# Patient Record
Sex: Female | Born: 1960
Health system: Southern US, Community
[De-identification: ages and names within clinical notes are randomized; demographics above are authoritative.]

## PROBLEM LIST (undated history)

## (undated) DIAGNOSIS — E079 Disorder of thyroid, unspecified: Secondary | ICD-10-CM

## (undated) DIAGNOSIS — J449 Chronic obstructive pulmonary disease, unspecified: Secondary | ICD-10-CM

## (undated) DIAGNOSIS — R0600 Dyspnea, unspecified: Secondary | ICD-10-CM

## (undated) DIAGNOSIS — Z9889 Other specified postprocedural states: Secondary | ICD-10-CM

## (undated) DIAGNOSIS — C801 Malignant (primary) neoplasm, unspecified: Secondary | ICD-10-CM

## (undated) DIAGNOSIS — F319 Bipolar disorder, unspecified: Secondary | ICD-10-CM

## (undated) DIAGNOSIS — R112 Nausea with vomiting, unspecified: Secondary | ICD-10-CM

## (undated) HISTORY — PX: HEMORROIDECTOMY: SUR656

## (undated) HISTORY — PX: ABDOMINAL HYSTERECTOMY: SHX81

## (undated) HISTORY — PX: BREAST LUMPECTOMY: SHX2

## (undated) HISTORY — DX: Disorder of thyroid, unspecified: E07.9

## (undated) HISTORY — PX: APPENDECTOMY: SHX54

## (undated) HISTORY — PX: CHOLECYSTECTOMY: SHX55

---

## 2001-02-25 ENCOUNTER — Encounter: Admission: RE | Admit: 2001-02-25 | Discharge: 2001-02-25 | Payer: Self-pay | Admitting: Family Medicine

## 2001-03-06 ENCOUNTER — Ambulatory Visit (HOSPITAL_COMMUNITY): Admission: RE | Admit: 2001-03-06 | Discharge: 2001-03-06 | Payer: Self-pay | Admitting: Internal Medicine

## 2001-09-10 ENCOUNTER — Other Ambulatory Visit: Admission: RE | Admit: 2001-09-10 | Discharge: 2001-09-10 | Payer: Self-pay | Admitting: Obstetrics and Gynecology

## 2001-09-11 ENCOUNTER — Ambulatory Visit (HOSPITAL_COMMUNITY): Admission: RE | Admit: 2001-09-11 | Discharge: 2001-09-11 | Payer: Self-pay | Admitting: Pulmonary Disease

## 2001-10-06 ENCOUNTER — Encounter: Payer: Self-pay | Admitting: Emergency Medicine

## 2001-10-06 ENCOUNTER — Emergency Department (HOSPITAL_COMMUNITY): Admission: EM | Admit: 2001-10-06 | Discharge: 2001-10-06 | Payer: Self-pay | Admitting: Emergency Medicine

## 2002-04-25 ENCOUNTER — Encounter: Payer: Self-pay | Admitting: Obstetrics and Gynecology

## 2002-04-25 ENCOUNTER — Inpatient Hospital Stay (HOSPITAL_COMMUNITY): Admission: RE | Admit: 2002-04-25 | Discharge: 2002-04-27 | Payer: Self-pay | Admitting: Obstetrics and Gynecology

## 2002-04-27 ENCOUNTER — Encounter: Payer: Self-pay | Admitting: Obstetrics and Gynecology

## 2004-04-21 ENCOUNTER — Inpatient Hospital Stay (HOSPITAL_COMMUNITY): Admission: EM | Admit: 2004-04-21 | Discharge: 2004-04-22 | Payer: Self-pay | Admitting: *Deleted

## 2005-03-16 ENCOUNTER — Emergency Department (HOSPITAL_COMMUNITY): Admission: EM | Admit: 2005-03-16 | Discharge: 2005-03-16 | Payer: Self-pay | Admitting: Emergency Medicine

## 2006-05-21 ENCOUNTER — Encounter (INDEPENDENT_AMBULATORY_CARE_PROVIDER_SITE_OTHER): Payer: Self-pay | Admitting: Diagnostic Radiology

## 2006-05-21 ENCOUNTER — Encounter (INDEPENDENT_AMBULATORY_CARE_PROVIDER_SITE_OTHER): Payer: Self-pay | Admitting: *Deleted

## 2006-05-21 ENCOUNTER — Encounter: Admission: RE | Admit: 2006-05-21 | Discharge: 2006-05-21 | Payer: Self-pay | Admitting: Family Medicine

## 2006-05-24 ENCOUNTER — Ambulatory Visit (HOSPITAL_COMMUNITY): Admission: RE | Admit: 2006-05-24 | Discharge: 2006-05-24 | Payer: Self-pay | Admitting: General Surgery

## 2006-05-30 ENCOUNTER — Ambulatory Visit (HOSPITAL_COMMUNITY): Admission: RE | Admit: 2006-05-30 | Discharge: 2006-05-30 | Payer: Self-pay | Admitting: General Surgery

## 2006-05-30 ENCOUNTER — Encounter (INDEPENDENT_AMBULATORY_CARE_PROVIDER_SITE_OTHER): Payer: Self-pay | Admitting: *Deleted

## 2006-06-08 ENCOUNTER — Ambulatory Visit (HOSPITAL_COMMUNITY): Admission: RE | Admit: 2006-06-08 | Discharge: 2006-06-08 | Payer: Self-pay | Admitting: General Surgery

## 2006-06-08 ENCOUNTER — Encounter (INDEPENDENT_AMBULATORY_CARE_PROVIDER_SITE_OTHER): Payer: Self-pay | Admitting: Specialist

## 2006-06-20 ENCOUNTER — Encounter: Admission: RE | Admit: 2006-06-20 | Discharge: 2006-06-20 | Payer: Self-pay | Admitting: Oncology

## 2006-06-20 ENCOUNTER — Encounter (HOSPITAL_COMMUNITY): Admission: RE | Admit: 2006-06-20 | Discharge: 2006-07-20 | Payer: Self-pay | Admitting: Oncology

## 2006-06-20 ENCOUNTER — Ambulatory Visit (HOSPITAL_COMMUNITY): Payer: Self-pay | Admitting: Oncology

## 2006-06-20 ENCOUNTER — Encounter (HOSPITAL_COMMUNITY): Admission: RE | Admit: 2006-06-20 | Discharge: 2006-06-22 | Payer: Self-pay | Admitting: Oncology

## 2006-07-02 ENCOUNTER — Ambulatory Visit (HOSPITAL_COMMUNITY): Admission: RE | Admit: 2006-07-02 | Discharge: 2006-07-02 | Payer: Self-pay | Admitting: Oncology

## 2006-07-23 ENCOUNTER — Encounter (HOSPITAL_COMMUNITY): Admission: RE | Admit: 2006-07-23 | Discharge: 2006-08-22 | Payer: Self-pay | Admitting: Oncology

## 2006-08-14 ENCOUNTER — Ambulatory Visit (HOSPITAL_COMMUNITY): Payer: Self-pay | Admitting: Oncology

## 2006-08-28 ENCOUNTER — Encounter (HOSPITAL_COMMUNITY): Admission: RE | Admit: 2006-08-28 | Discharge: 2006-09-07 | Payer: Self-pay | Admitting: Oncology

## 2006-08-28 ENCOUNTER — Encounter: Admission: RE | Admit: 2006-08-28 | Discharge: 2006-09-07 | Payer: Self-pay | Admitting: Oncology

## 2006-09-21 ENCOUNTER — Ambulatory Visit (HOSPITAL_COMMUNITY): Admission: RE | Admit: 2006-09-21 | Discharge: 2006-09-21 | Payer: Self-pay | Admitting: Oncology

## 2006-09-25 ENCOUNTER — Encounter: Admission: RE | Admit: 2006-09-25 | Discharge: 2006-09-25 | Payer: Self-pay | Admitting: Oncology

## 2006-09-25 ENCOUNTER — Encounter (HOSPITAL_COMMUNITY): Admission: RE | Admit: 2006-09-25 | Discharge: 2006-10-25 | Payer: Self-pay | Admitting: Oncology

## 2006-10-15 ENCOUNTER — Ambulatory Visit (HOSPITAL_COMMUNITY): Payer: Self-pay | Admitting: Oncology

## 2006-10-29 ENCOUNTER — Encounter: Admission: RE | Admit: 2006-10-29 | Discharge: 2006-10-29 | Payer: Self-pay | Admitting: Oncology

## 2006-10-29 ENCOUNTER — Encounter (HOSPITAL_COMMUNITY): Admission: RE | Admit: 2006-10-29 | Discharge: 2006-11-28 | Payer: Self-pay | Admitting: Oncology

## 2007-06-04 ENCOUNTER — Encounter (HOSPITAL_COMMUNITY): Admission: RE | Admit: 2007-06-04 | Discharge: 2007-07-04 | Payer: Self-pay | Admitting: Oncology

## 2007-06-04 ENCOUNTER — Ambulatory Visit (HOSPITAL_COMMUNITY): Payer: Self-pay | Admitting: Oncology

## 2007-12-03 ENCOUNTER — Ambulatory Visit (HOSPITAL_COMMUNITY): Payer: Self-pay | Admitting: Oncology

## 2007-12-03 ENCOUNTER — Encounter (HOSPITAL_COMMUNITY): Admission: RE | Admit: 2007-12-03 | Discharge: 2007-12-11 | Payer: Self-pay | Admitting: Oncology

## 2008-01-22 ENCOUNTER — Ambulatory Visit: Payer: Self-pay | Admitting: Orthopedic Surgery

## 2008-01-22 DIAGNOSIS — M7580 Other shoulder lesions, unspecified shoulder: Secondary | ICD-10-CM

## 2008-01-22 DIAGNOSIS — M25519 Pain in unspecified shoulder: Secondary | ICD-10-CM | POA: Insufficient documentation

## 2008-01-22 DIAGNOSIS — M25819 Other specified joint disorders, unspecified shoulder: Secondary | ICD-10-CM | POA: Insufficient documentation

## 2008-01-28 ENCOUNTER — Encounter (HOSPITAL_COMMUNITY): Admission: RE | Admit: 2008-01-28 | Discharge: 2008-02-27 | Payer: Self-pay | Admitting: Oncology

## 2008-01-28 ENCOUNTER — Ambulatory Visit (HOSPITAL_COMMUNITY): Payer: Self-pay | Admitting: Oncology

## 2008-10-02 ENCOUNTER — Ambulatory Visit (HOSPITAL_COMMUNITY): Payer: Self-pay | Admitting: Oncology

## 2008-10-09 ENCOUNTER — Encounter: Admission: RE | Admit: 2008-10-09 | Discharge: 2008-10-09 | Payer: Self-pay | Admitting: Oncology

## 2009-04-19 ENCOUNTER — Ambulatory Visit (HOSPITAL_COMMUNITY): Admission: RE | Admit: 2009-04-19 | Discharge: 2009-04-19 | Payer: Self-pay | Admitting: Family Medicine

## 2009-10-11 ENCOUNTER — Encounter: Admission: RE | Admit: 2009-10-11 | Discharge: 2009-10-11 | Payer: Self-pay | Admitting: Oncology

## 2010-10-12 ENCOUNTER — Encounter: Admission: RE | Admit: 2010-10-12 | Discharge: 2010-10-12 | Payer: Self-pay | Admitting: Oncology

## 2011-03-22 ENCOUNTER — Emergency Department (HOSPITAL_COMMUNITY): Payer: Managed Care, Other (non HMO)

## 2011-03-22 ENCOUNTER — Emergency Department (HOSPITAL_COMMUNITY)
Admission: EM | Admit: 2011-03-22 | Discharge: 2011-03-22 | Disposition: A | Discharge status: Home / Self Care | Payer: Managed Care, Other (non HMO) | Attending: Emergency Medicine | Admitting: Emergency Medicine

## 2011-03-22 DIAGNOSIS — R0789 Other chest pain: Secondary | ICD-10-CM | POA: Insufficient documentation

## 2011-03-22 DIAGNOSIS — E876 Hypokalemia: Secondary | ICD-10-CM | POA: Insufficient documentation

## 2011-03-22 LAB — CBC
Hemoglobin: 14.6 g/dL (ref 12.0–15.0)
MCH: 30.8 pg (ref 26.0–34.0)
MCHC: 34.2 g/dL (ref 30.0–36.0)
RDW: 13.8 % (ref 11.5–15.5)
WBC: 17.2 10*3/uL — ABNORMAL HIGH (ref 4.0–10.5)

## 2011-03-22 LAB — DIFFERENTIAL
Eosinophils Absolute: 0.1 10*3/uL (ref 0.0–0.7)
Eosinophils Relative: 0 % (ref 0–5)
Lymphocytes Relative: 27 % (ref 12–46)
Lymphs Abs: 4.6 10*3/uL — ABNORMAL HIGH (ref 0.7–4.0)
Neutrophils Relative %: 67 % (ref 43–77)

## 2011-03-22 LAB — POCT CARDIAC MARKERS
CKMB, poc: 1.3 ng/mL (ref 1.0–8.0)
CKMB, poc: <1 ng/mL — ABNORMAL LOW (ref 1.0–8.0)
Myoglobin, poc: 70.9 ng/mL (ref 12–200)
Troponin i, poc: <0.05 ng/mL (ref 0.00–0.09)

## 2011-03-22 LAB — BASIC METABOLIC PANEL
BUN: 6 mg/dL (ref 6–23)
Calcium: 9.2 mg/dL (ref 8.4–10.5)
Chloride: 106 mEq/L (ref 96–112)
GFR calc Af Amer: >60 mL/min (ref >60)
GFR calc non Af Amer: >60 mL/min (ref >60)
Glucose, Bld: 123 mg/dL — ABNORMAL HIGH (ref 70–99)

## 2011-03-22 LAB — D-DIMER, QUANTITATIVE: D-Dimer, Quant: 0.22 ug/mL-FEU (ref 0.00–0.48)

## 2011-03-27 ENCOUNTER — Other Ambulatory Visit (HOSPITAL_COMMUNITY): Payer: Self-pay | Admitting: Oncology

## 2011-03-27 DIAGNOSIS — Z09 Encounter for follow-up examination after completed treatment for conditions other than malignant neoplasm: Secondary | ICD-10-CM

## 2011-04-12 ENCOUNTER — Encounter (INDEPENDENT_AMBULATORY_CARE_PROVIDER_SITE_OTHER): Payer: Self-pay | Admitting: Internal Medicine

## 2011-04-28 NOTE — Op Note (Signed)
[facility]  Patient:    Becky Gutierrez, Becky Gutierrez Visit Number: [id number] MRN: [id number]          Service Type: MED Location: 3A A308 01 Attending Physician:  Tilda Burrow Dictated by:   Christin Bach, M.D. Admit Date:  04/24/2002 Discharge Date: 04/27/2002                             Operative Report  PREOPERATIVE DIAGNOSIS:  Postoperative pelvic hematoma.  POSTOPERATIVE DIAGNOSIS:  Postoperative pelvic hematoma.  OPERATION/PROCEDURE:  Laparoscopic evacuation of pelvic hematoma.  SURGEON:  Christin Bach, M.D.  ASSISTANT:  None.  ANESTHESIA:  General -- Wahler, CRNA  COMPLICATIONS:  None.  FINDINGS:  Large, left-sided pelvic hematoma, apparently emanating from the area of the infundibulopelvic ligament and the lateral pelvis dissection.  No ongoing bleeding noted.  DETAILS OF PROCEDURE:  The patient was taken to the operating room and prepped and draped for combined abdominal and vaginal procedure.  With sponge stick placed intravaginally, Foley catheter placed, and abdomen prepped.  The subcuticular 4-0 Dexon sutures at the umbilicus, suprapubic site, right lower quadrant were removed and the suprapubic fascial 0 Vicryl suture identified and opened.  Easy placement of a blunt trocar with the camera used for visualization easily confirmed that we were in without difficulty.  Pneumoperitoneum was achieved under 3 L of CO2, and then the umbilical site could be identified and the 11 mm trocar inserted there, under direct visualization.  The right lower quadrant trocar was similarly placed under direct visualization.  The left lower quadrant site was left intact.  We proceeded to inspect the pelvis and the predominance of the clotting was in the pelvis and the left lower quadrant.  Suction curettage using the suction evacuator, we then proceeded with removal of 300 cc or thereabouts of formed clot which was extractable very easily.  We then  began irrigation and used irrigation to remove thin, filmy bits of clot from the surfaces of the bowel and the lower abdomen.  The upper abdomen was inspected and there was only some thin, watery, serous fluid in the upper area around the liver.  Additional irrigation in the upper quadrants was performed and then suctioned out once the fluid returned to the abdominal area while the patient was in reverse Trendelenburg.  We then inspected the pelvis again, suctioned the area dry and observed the left lower quadrant for any evidence of oozing.  After approximately 10 minutes of suctioning and intermittent inspecting of the left lower quadrant, there was only some small clot remnants which were considered useful to leave, and these were left in place.  Three photos were taken to document intermittent parts of the process.  At this time, deflation of the abdomen and removal of the laparoscopic ports with direct visualization and removal of the suprapubic and right lower quadrant trocar sites performed.  There was absolutely no evidence of bleeding from any of the trocar sites.  We then closed with fascia at the suprapubic and umbilical site with interrupted suture of #0 Vicryl, infiltrated the fascia with Marcaine 0.25% solution as well as infiltrating the subcutaneous tissues, and 4-0 Dexon subcuticular closure of the skin incisions was performed.  Steri-Strips were placed followed by OpSite and the patient tolerated the procedure well and went to recovery room in good condition. Dictated by:   Christin Bach, M.D. Attending Physician:  Tilda Burrow DD:  04/25/02 TD:  04/28/02 Job:  [id number]

## 2011-04-28 NOTE — Op Note (Signed)
[facility]  Patient:    Becky Gutierrez, Becky Gutierrez Visit Number: [id number] MRN: [id number]          Service Type: MED Location: 3A A308 01 Attending Physician:  Tilda Burrow Dictated by:   Christin Bach, M.D. Proc. Date: 04/24/02 Admit Date:  04/24/2002 Discharge Date: 04/27/2002                             Operative Report  PREOPERATIVE DIAGNOSIS:  Entrapped left ovary.  POSTOPERATIVE DIAGNOSIS:  Entrapped left ovary.  PROCEDURE:  Laparoscopic left salpingo-oophorectomy.  SURGEON:  Christin Bach, M.D.  ASSISTANTRobin Searing, ______, CST.  ANESTHESIA:  General.  COMPLICATIONS:  None.  FINDINGS:  Densely entrapped left tube and ovary, able to be shelled out nicely.  INDICATIONS FOR PROCEDURE:  A 50 year old female with longstanding left lower quadrant pain inadequately controlled with hormonal suppression for whom long-term OC use is not an option.  DESCRIPTION OF PROCEDURE:  The patient was taken to the operating room, prepped and draped in the usual fashion for a combined abdominal and vaginal procedure. An infraumbilical vertical 1 cm skin incision was made as well as a transverse suprapubic 1 cm incision. The Veress needle was introduced through the umbilicus and pneumoperitoneum achieved using 3 liters CO2. Laparoscopic direct visualization was used to place the laparoscopic trocar. There were some subumbilical thin adhesions. We were able to place the suprapubic trocar without difficulty and a right lower quadrant 5 mm trocar was also placed. The suprapubic trocar was a 12 mm cutting trocar placed under direct visualization. The subumbilical thin filmy omental adhesions were cut free from the anterior abdominal wall without difficulty. We then proceeded to inspect the pelvis further. The left ovary could be visualized through a small window of adhesions between the remnants of the round ligament and the left ovary itself. This was picked  up, inspected and photo documented. The ovary was essentially obscured except for this small pocket which allowed visualization. We proceeded with shelling out thin filmy adhesions and then grasping the fallopian tube, elevating it and serially using a combination of blunt and sharp dissection peeling away the adjacent sigmoid colon adhesions and gradually identifying the pedicle that represented the tube and ovary complex. This was eventually shelled up free so that only a small amount of the attachments from its inferior portion, the remnants of the utero-ovarian ligament, which were attached to the sigmoid itself. We were able to get a clear view of the fimbria of the left tube, the ovary itself and thereby maintained orientation. The infundibulopelvic ligament was isolated, and cross clamped as in photo 3 with endoGIA placed through the umbilical port. This was fired and the ovary released easily. We then proceeded with using sharp and blunt dissection to free up the remaining remnants of the ovarian attachments to the sigmoid and sigmoid mesentery. This was completed and irrigation copiously performed confirming hemostasis. The procedure was considered successful. While the ureter could not be identified, we were able to be very careful in staying out of the retroperitoneum so risk to this structure was considered minimal.  Approximately 100 cc saline was left in the abdomen and the infraumbilical and suprapubic incisions were closed once laparoscopic instruments were removed, closed at both the fascial level and subcuticular. The level 5 mm ports on each side only required subcuticular 4-0 Dexon closure. Steri-Strips were placed followed by OpSite and the patient went to the recovery room.  Dictated by:   Christin Bach, M.D. Attending Physician:  Tilda Burrow DD:  04/24/02 TD:  04/28/02 Job: [id number]

## 2011-04-28 NOTE — Cardiovascular Report (Signed)
[facility]  Patient:    Becky Gutierrez, Becky Gutierrez                        MRN: [id number] Proc. Date: 03/06/01 Adm. Date:  [id number] Disc. Date: [id number] Attending:  Lewayne Bunting CC:         John Giovanni, M.D.  Thomas C. Daleen Squibb, M.D. [facility]  CV Laboratory  Gerrit Friends. Dietrich Pates, M.D. [facility]   Cardiac Catheterization  INDICATIONS:  Recurrent episodes of chest pain.  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  Multiple studies have been done including those to rule out pulmonary emboli.  Because of continued and recurrent chest pain, the patient wanted to have a cardiac catheterization, and this was discussed with Dr. Sudie Bailey and Dr. Daleen Squibb.  She was referred to the catheterization lab for diagnostic angiogram.  PROCEDURES: 1. Left heart catheterization. 2. Selective coronary arteriography. 3. Selective left ventriculography. 4. Root aortography.  DESCRIPTION OF PROCEDURE:  The procedure was performed from the right femoral artery using 6 French catheters.  She tolerated the procedure well without complication.  We used a Noto to engage the right as there was some catheter-related coronary spasm.  There were no major complications.  She tolerated the procedure well.  HEMODYNAMICS:  The central aortic pressure was 101/62.  LV pressure 104/12. There was no gradient on pullback across the aortic valve.  ANGIOGRAPHIC DATA:  LEFT VENTRICULOGRAPHY:  Ventriculography performed in the RAO projection revealed preserved global systolic function.  No segmental abnormalities contraction were identified.  Ejection fraction appeared to be in excess of 55%.  Proximal root aortography revealed good motion of the aortic leaflets.  No significant aortic regurgitation was noted.  There was no evidence of proximal aortic root dissection.  There was no obvious calcification of the coronary arteries.  The left main coronary  artery was a large caliber vessel being approximately 5 mm in size and without significant focal disease.  The left anterior descending artery coursed to the apex where it just barely wrapped the apical tip.  There was one major diagonal branch and multiple septal perforators.  The LAD was free of significant disease.  The circumflex proper provided a tiny first marginal and then two subsequent marginal branches.  No focal lesions were noted.  The right coronary artery demonstrated some catheter-induced spasm just inside the ostium.  This was at the tip of the catheter.  However, after the administration of intracoronary nitroglycerin, there was no significant focal narrowing.  The posterior descending branch and posterolateral branch were free of critical disease.  CONCLUSIONS: 1. Normal left ventricular function. 2. No evidence of aortic root dissection. 3. No significant coronary focal abnormalities.  DISPOSITION:  I have spoken with Dr. Sudie Bailey.  They are in agreement that she will be discharged from the day catheterization area later in the day. She will follow up in the office to see him. DD:  03/06/01 TD:  03/07/01 Job: [id number]

## 2011-04-28 NOTE — Procedures (Signed)
[facility]  Patient:    Becky Gutierrez, Becky Gutierrez Visit Number: [id number] MRN: [id number]          Service Type: Attending:  Kari Baars, M.D. Dictated by:   Kari Baars, M.D. Adm. Date:  09/11/01                      Pulmonary Function Test Inter.  IMPRESSION: 1. Spirometry is normal. 2. Lung volumes are normal. 3. DLCO is normal. Dictated by:   Kari Baars, M.D. Attending:  Kari Baars, M.D. DD:  09/11/01 TD:  09/12/01 Job: [id number]

## 2011-04-28 NOTE — H&P (Signed)
Becky Gutierrez, Becky Gutierrez        ACCOUNT NO.:  [id number]   MEDICAL RECORD NO.:  [id number]           PATIENT TYPE:  AMB   LOCATION:                                FACILITY:  [facility]   PHYSICIAN:  Dalia Heading, M.D.  DATE OF BIRTH:  09/25/1961   DATE OF ADMISSION:  DATE OF DISCHARGE:  LH                                HISTORY & PHYSICAL   CHIEF COMPLAINT:  Left breast carcinoma.   HISTORY OF PRESENT ILLNESS:  The patient is a 50 year old white female who  is referred for evaluation and treatment of left breast carcinoma.  This was  found on self-breast examination.  A biopsy of this at the breast center was  found to be invasive mammary carcinoma.  She has no immediate family history  of breast carcinoma.  She had left breast biopsy in the remote past, which  was negative for malignancy.   PAST MEDICAL HISTORY:  Bipolar disorder, unspecified SVT which has resolved,  asthma, carpal tunnel syndrome.   PAST SURGICAL HISTORY:  C-sections x2, TAH, appendectomy, benign tumor  removal of left breast, hemorrhoidectomy, and laparoscopic cholecystectomy.   CURRENT MEDICATIONS:  1.  Zyprexa 5 mg p.o. q.4h. p.r.n.  2.  Wellbutrin 150 mg p.o. t.i.d.  3.  Multivitamin with iron q.d.  4.  Calcium supplements.   ALLERGIES:  PHENERGAN, CODEINE, MORPHINE, XANAX.   REVIEW OF SYSTEMS:  The patient denies drinking or smoking.   PHYSICAL EXAMINATION:  The patient is a well-developed, well-nourished white  female in no acute distress.  NECK: Supple without lymphadenopathy.  LUNGS:  Clear to auscultation with equal breath sounds bilaterally.  No  expiratory wheezing is noted.  HEART: Regular rate and rhythm without S3, S4, or murmurs.  BREASTS: Right breast examination reveals no dominant mass, nipple  discharge, or dimpling.  The axilla is negative for palpable nodes.  Left  breast examination reveals a 2-cm dominant mass noted to 2 o'clock position.  The axilla is negative for palpable  nodes.   IMPRESSION:  Left breast carcinoma.   PLAN:  The patient is scheduled for left partial mastectomy, sentinel lymph  node biopsy, possible axillary dissection on May 30, 2006.  The risks and  benefits of the procedure including bleeding, infection, nerve injury, arm  swelling, possibility of the re-excisional surgery were fully explained to  the patient, gave informed consent.      Dalia Heading, M.D.  Electronically Signed     MAJ/MEDQ  D:  05/29/2006  T:  05/29/2006  Job:  [id number]   cc:   Mila Homer. Sudie Bailey, M.D.  Fax: [phone]

## 2011-04-28 NOTE — H&P (Signed)
Becky Gutierrez, FOULKS NO.:  [id number]   MEDICAL RECORD NO.:  [id number]                  PATIENT TYPE:   LOCATION:                                       FACILITY:   PHYSICIAN:  Lionel December, M.D.                 DATE OF BIRTH:  1961-11-12   DATE OF ADMISSION:  DATE OF DISCHARGE:                                HISTORY & PHYSICAL   CHIEF COMPLAINT:  Chronic diarrhea x1 month and bright red blood per rectum  with every bowel movement.   HISTORY OF PRESENT ILLNESS:  Patient states that she has had chronic  diarrhea for one month.  Patient states that she has between 1-20 bowel  movements per day and that she sees bright red blood on the toilet paper  with every bowel movement.  The patient denies melena and states that she  has abdominal pain when she is having stool and that she sometimes passes  clots of blood.  The patient has had a previous history of rectal bleeding  and had a flexible sigmoidoscopy on September 25, 1996, done by Dr. Karilyn Cota.  The patient was diagnosed as having hematochezia secondary to hemorrhoids.  On that same date, there was a flat polyp removed in the proximal sigmoid  colon with an edematous surface.  It was later shown to be hyperplastic  polyps.  The patient subsequently had a hemorrhoidectomy on October 14, 1996.  Patient states that she has no symptoms of reflux.  She has no  hematemesis.  She has no swallowing difficulties.  She does report a history  of PAT.  Patient further reports that she takes multiple medications for  depression, bipolar disorder, asthma, and also hormone replacement therapy.  Patient denies shortness of breath or chest pain.  She denies edema of the  extremities.   OBJECTIVE:  VITAL SIGNS:  Weight 177-3/4.  Height 5 foot 8 inches.  Blood  pressure 110/70, pulse 86.  HEENT:  Head is normocephalic and atraumatic.  Sclerae are anicteric.  Conjunctivae are pink.  Oropharynx is pink and moist  without lesions.  Patient has some clear rhinorrhea.  There are several shotty submandibular  lymph nodes.  NECK:  No masses.  No thyromegaly.  LUNGS:  Clear to auscultation bilaterally.  HEART:  Regular rate and rhythm without murmurs, rubs or gallops.  EXTREMITIES:  No clubbing, cyanosis or edema.  ABDOMEN:  Soft without hepatosplenomegaly.  Bowel sounds are present x4.  No  masses were palpated.  There is moderate tenderness to palpation in the left  lower quadrant.  RECTAL:  The patient has at least external hemorrhoids.  No fissure was  noted.  No masses were noted.  Sphincter tone was normal.  Hemoccult was  positive.  SKIN:  Warm, dry, and without jaundice.   IMPRESSION:  A patient with a history of rectal bleeding and hemorrhoids  presents with chronic  diarrhea and bright red blood per rectum.  Patient has  been diagnosed as having irritable bowel syndrome with a diarrheal  component.  Patient is currently on multiple medications for bipolar  disorder.  The patient states she has been having pain with defecation and  significant hematochezia.   Laboratory studies were done at [facility] on May 03, 2004.  WBC is  14.8, hemoglobin 12.6, hematocrit 36.7, MCV 91.4, platelet count 400.  Sodium 141, potassium 3.4, chloride 111, CO2 25, glucose 153, BUN/creatinine  6/0.7, calcium 9.1.  Total bilirubin 0.5, alkaline phosphatase 75, SGOT 15,  SGPT 14, total protein 5.8, albumin 3.3.  Stool WBC was negative.  C. diff  toxin was negative.  Helicobacter pylori was negative.   Patient's laboratory studies are significant for an elevated white blood  cell count, otherwise unremarkable.  Patient has had a hemorrhoidectomy, and  it is likely that the bright red blood she is seeing is from a recurrence of  hemorrhoid disease.   The patient did have a flexible sigmoid in 1997 with the removal of a  hyperplastic polyp.  The patient should be considered for colonoscopy.   PLAN:   Cort enemas b.i.d. x14 days.  Patient was advised and given samples  of fiber to take 1 q.d. to help smooth out the bowel movements of her  irritable bowel syndrome.  The patient will be scheduled for a colonoscopy  with Dr. Karilyn Cota, per her request.  More recommendations to follow after the  procedure.   Thank you for allowing [place] to participate in the care of this patient.     _____________________________________  ___________________________________________  Ashok Pall, PA                        Lionel December, M.D.   GC/MEDQ  D:  05/03/2004  T:  05/03/2004  Job:  [id number]   cc:   Mila Homer. Sudie Bailey, M.D.  [address] [place], [place] [address]  Fax: [phone]

## 2011-04-28 NOTE — H&P (Signed)
NAME:  DEJAI, SCHUBACH                         ACCOUNT NO.:  [id number]   MEDICAL RECORD NO.:  [id number]                   PATIENT TYPE:  INP   LOCATION:  IC06                                 FACILITY:  [facility]   PHYSICIAN:  Mila Homer. Sudie Bailey, M.D.           DATE OF BIRTH:  1961-11-19   DATE OF ADMISSION:  04/21/2004  DATE OF DISCHARGE:                                HISTORY & PHYSICAL   ADDENDUM:  She has had asthma for some years and has been on Flovent two  puffs a day in the past.   DIAGNOSES:  1. Asthma.  2. Tobacco use disorder.   PLAN:  1. Treatment for the asthma will be Singulair 10 mg daily, Flovent 44 two     puffs daily with Albuterol two puffs q.4h p.r.n. asthma .  2. We will also put her back on Wellbutrin 150 mg b.i.d.     ___________________________________________                                         Mila Homer. Sudie Bailey, M.D.   SDK/MEDQ  D:  04/21/2004  T:  04/21/2004  Job:  [id number]

## 2011-04-28 NOTE — Op Note (Signed)
NAMETHAO, BAUZA        ACCOUNT NO.:  [id number]   MEDICAL RECORD NO.:  [id number]          PATIENT TYPE:  AMB   LOCATION:  DAY                           FACILITY:  [facility]   PHYSICIAN:  Dalia Heading, M.D.  DATE OF BIRTH:  Dec 27, 1960   DATE OF PROCEDURE:  05/30/2006  DATE OF DISCHARGE:                                 OPERATIVE REPORT   PREOPERATIVE DIAGNOSIS:  Left breast carcinoma.   POSTOPERATIVE DIAGNOSIS:  Left breast carcinoma.   PROCEDURE:  Left partial mastectomy, sentinel lymph node biopsy, injection  of left breast.   SURGEON:  Dalia Heading, M.D.   ANESTHESIA:  General endotracheal.   INDICATIONS:  The patient is a 50 year old white female recently diagnosed  with infiltrating mammary carcinoma of the left breast.  This was confirmed  by tissue biopsy.  The patient now comes to the operating room for left  partial mastectomy, sentinel lymph node biopsy, possible axillary  dissection.  The risks and benefits of the procedures including bleeding,  infection, pain, and the possibility of left arm swelling were fully  explained to the patient who gave informed consent.  She also realizes she  may have to come back for re-excision should the margins of the left breast  tumor be not clear.   PROCEDURE NOTE:  The patient underwent radioactive nuclide injection two  hours prior to the beginning of the procedure.  This was done in the  retroareolar region.  After the induction of general endotracheal  anesthesia, the left breast was prepped and draped in the usual sterile  technique with Betadine.  Surgical site confirmation was performed.  4 mL of  blue dye was injected subdermally over the tumor which was at the 2 o'clock  position.  This was then massaged for 5 minutes..   We first began with the sentinel lymph node biopsy.  The en vivo count was  200 and ex vivo count of the first lymph node was 575.  The lymph node was  blue.  A second blue lymph node was  also found with en vivo counts of 400,  ex vivo counts of 500.  The basin count was less than 50.  These lymph nodes  were sent for frozen section and the pathologist stated that these were  negative for malignancy.  The wound was irrigated normal saline.  Any  bleeding was controlled using small clips and Bovie electrocautery.  The  subcutaneous layer was reapproximated using a 3-0 Vicryl interrupted suture.  The skin was closed using a 4-0 Vicryl subcuticular suture.  Dermabond was  then applied.  0.5% Sensorcaine was instilled in the surrounding wound.   Next, an incision was made over the tumor which measured greater than 2 cm  in size.  The tumor was excised with grossly normal appearing breast tissue  circumferentially.  The tumor was oriented with a long suture lateral and a  short suture superior.  The specimen was sent to pathology for further  examination.  Any bleeding was controlled using Bovie electrocautery.  The  wound was irrigated with normal saline.  0.5% Sensorcaine was instilled in  the surrounding skin.  The skin was closed using a 4-0 Vicryl subcuticular  suture.  Dermabond was then applied.   All tape and needle counts were correct at the end of the procedure.  The  patient was extubated in the operating room and went back to the recovery  room awake in stable condition.   COMPLICATIONS:  None.   SPECIMEN:  Sentinel lymph nodes x2, left breast tissue.   BLOOD LOSS:  Less than 100 mL.      Dalia Heading, M.D.  Electronically Signed     MAJ/MEDQ  D:  05/30/2006  T:  05/30/2006  Job:  [id number]   cc:   Mila Homer. Sudie Bailey, M.D.  Fax: [phone]

## 2011-04-28 NOTE — Op Note (Signed)
Becky Gutierrez, MARKIE        ACCOUNT NO.:  [id number]   MEDICAL RECORD NO.:  [id number]          PATIENT TYPE:  AMB   LOCATION:  DAY                           FACILITY:  [facility]   PHYSICIAN:  Dalia Heading, M.D.  DATE OF BIRTH:  02/21/1961   DATE OF PROCEDURE:  06/08/2006  DATE OF DISCHARGE:                                 OPERATIVE REPORT   PREOPERATIVE DIAGNOSIS:  Left breast carcinoma, status post left partial  mastectomy with involved margin.   POSTOPERATIVE DIAGNOSIS:  Left breast carcinoma, status post left partial  mastectomy with involved margin.   PROCEDURE:  Left breast biopsy.   SURGEON:  Dalia Heading, M.D.   ANESTHESIA:  MAC.   INDICATIONS:  The patient is a 50 year old white female status post a left  partial mastectomy for invasive ductal carcinoma, who was found to have the  anterior margin containing tumor.  The patient comes back to the operating  room for re-excision of the anterior aspect of the previous left partial  mastectomy.  The risks and benefits of the procedure were fully explained to  the patient, who gave informed consent.   PROCEDURE NOTE:  The patient was placed in supine position.  The left breast  was prepped and draped using the usual sterile technique with Betadine.  Xylocaine 1% was used local anesthesia.   An elliptical incision was made around the previous surgical incision site.  This was in the upper, outer portion of the left breast.  Skin along with  some tissue from the anterior portion of the previous mastectomy site was  excised in total without difficulty.  A suture was placed medial to orient  the specimen.  The specimen sent to pathology for further examination.  Any  bleeding was controlled using Bovie electrocautery.  The wound was irrigated  with normal saline.  The wound was grossly inspected and no obvious tumor  remained in the specimen.  The skin was closed using a 4-0 Vicryl  subcuticular suture.  Dermabond was  then applied.   All tape and needle counts were correct at the end of the procedure.  The  patient was transferred to PACU in stable condition.   COMPLICATIONS:  None.   SPECIMEN:  Left breast tissue, suture medial.   BLOOD LOSS:  Minimal.      Dalia Heading, M.D.  Electronically Signed     MAJ/MEDQ  D:  06/08/2006  T:  06/08/2006  Job:  [id number]   cc:   Ladona Horns. Mariel Sleet, MD  Fax: [phone]   Mila Homer. Sudie Bailey, M.D.  Fax: [phone]

## 2011-04-28 NOTE — Procedures (Signed)
Becky Gutierrez, Becky Gutierrez               ACCOUNT NO.:  [id number]   MEDICAL RECORD NO.:  [id number]          PATIENT TYPE:  OUT   LOCATION:  RESP                          FACILITY:  [facility]   PHYSICIAN:  Edward L. Juanetta Gosling, M.D.DATE OF BIRTH:  1961/07/11   DATE OF PROCEDURE:  DATE OF DISCHARGE:  04/19/2009                            PULMONARY FUNCTION TEST   1. Spirometry is normal.  2. Lung volumes are normal.  3. DLCO is normal.      Edward L. Juanetta Gosling, M.D.  Electronically Signed     ELH/MEDQ  D:  04/23/2009  T:  04/23/2009  Job:  [id number]   cc:   Mila Homer. Sudie Bailey, M.D.  Fax: [phone]

## 2011-04-28 NOTE — Discharge Summary (Signed)
[facility]  Patient:    Becky Gutierrez, Becky Gutierrez Visit Number: [id number] MRN: [id number]          Service Type: MED Location: 3A A308 01 Attending Physician:  Tilda Burrow Dictated by:   Christin Bach, M.D. Admit Date:  04/24/2002 Discharge Date: 04/27/2002                             Discharge Summary  ADMISSION DIAGNOSIS:  Tender left ovary secondary to ovarian adhesions status post hysterectomy.  DISCHARGE DIAGNOSES: 1. Tender left ovary secondary to ovarian adhesions status post hysterectomy. 2. Postoperative pelvic hematoma, anemia secondary to blood loss. 3. Bilateral pneumonia, bilateral interstitial pneumonia. 4. Anemia secondary to postoperative bleeding.  DISCHARGE MEDICATIONS: 1. Tylox 1 p.o. q.4h. p.r.n. pain dispense #20. 2. Z-Pak 500 mg p.o. x5 tablets over 4 days. 3. Toradol 10 mg p.o. q.8h. p.r.n. pain during work day. 4. Iron sulfate 325 mg p.o. b.i.d. x30 days. 5. Inhaler Serevent MDI 2 puffs b.i.d. 6. Flovent 110 mcg 2 puffs b.i.d. 7. Albuterol MDI 2 puffs q.4h. p.r.n.  HISTORY OF PRESENT ILLNESS:  This 50 year old female is status post multiple GYN surgeries and was admitted for laparoscopic removal of the residual left ovary.  She has had progressive incapacitating functional left ovarian discomfort which has no longer effectively suppressed with oral contraceptive management.  See admitting history for further details.  HOSPITAL COURSE:  The patient was admitted through day surgery with admission hemoglobin of 13.0, hematocrit 38.6, white count 11,000.  She underwent laparoscopic left salpingo-oophorectomy which required dissection of the left tube and ovary free from the bowel, the sigmoid colon, as well as from the remnants of the round ligament on the left.  This was performed in an apparent effective fashion with intraoperative blood loss estimated at 100 cc.  Postoperatively the patient had greater than normal amounts  of nausea, occasional vomiting and received IV Zofran without adequate relief.  On the afternoon of surgery she was noted to have some fullness on the right side around the site of the right lower quadrant trocar port.  There was some oozing there and it was thought that perhaps she was bleeding from the abdominal wall.  Ice pack and pressure were encouraged.  The patient chose not to use this.  Over the night the patient had progression of bruising in the right lower quadrant predominantly with extension into the inguinal area even effecting the right labia majora.  The patient also had some settling into the right flank.  CBC showed her hemoglobin dropped to 9.2, with hematocrit 27.1, on Apr 25, 2002, evidencing a large amount of postoperative blood loss out of proportion to the surgical event.  She then was having persistent pain and nausea and CT scan of the abdomen revealed intrapelvic hematoma rather than abdominal wall hematoma.  White count was markedly elevated on the morning of Apr 25, 2002, at 26,800 with 78 neutrophils.  She was found on CT of the abdomen to have the pelvic hematoma.  Additionally CT noted the presence of atelectasis in the left lower lobe consistent with left lower lobe pneumonia.  There was minimal hemorrhage in the subcutaneous tissues on the right lateral abdominal wall.  The patients pain and nausea were sufficient that re-exploration was considered warranted and she was taken to the operating room that afternoon with evacuation of approximately 400 cc. of formed clot in the pelvis which was thought to represent  at least 1000 cc. of blood loss.  The patient then had significant improvement in her discomfort and resolution of her nausea that evening postoperatively.  The following morning hematocrit had equilibrated further to a hemoglobin of 8.0, hematocrit 23.4.  This remained stable for an additional 24 hours.  The patient had an appropriate increase  in her fatigue after excursion based on the low hematocrit.  She ran temperatures as high a 102.0 noted on the day of the re-exploration.  Additionally her leukocytosis continued with a white count of 32,200 on the evening of Apr 25, 2002, gradually declining until 25,100 on the day of discharge Apr 27, 2002.  On Apr 26, 2002, she only had a single temperature greater than 100.0, 101.5 noted at 9 p.m.  On the day of discharge she improved sufficiently through the day to be discharged at 5 p.m. on Apr 27, 2002, for follow up in our office.  FOLLOWUP:  Follow up in 1 week in our office.  ACTIVITY:  The patient is to have limited activity.  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 Dictated by:   Christin Bach, M.D. Attending Physician:  Tilda Burrow DD:  04/27/02 TD:  04/29/02 Job: [id number] JO/[id number]

## 2011-04-28 NOTE — H&P (Signed)
Becky Gutierrez, SCHORR        ACCOUNT NO.:  [id number]   MEDICAL RECORD NO.:  [id number]          PATIENT TYPE:  AMB   LOCATION:  DAY                           FACILITY:  [facility]   PHYSICIAN:  Dalia Heading, M.D.  DATE OF BIRTH:  03-May-1961   DATE OF ADMISSION:  DATE OF DISCHARGE:  LH                                HISTORY & PHYSICAL   AGE:  50 years old   CHIEF COMPLAINT:  Left breast carcinoma.   HISTORY OF PRESENT ILLNESS:  The patient is a 51 year old white female  status post a left partial mastectomy with sentinel lymph node biopsy on  May 30, 2006, who was found to have invasive ductal carcinoma.  The  anterior margin was noted to have tumor at that area.  The patient now comes  back to the operating for re-excision of the anterior aspect of the partial  mastectomy site.   PAST MEDICAL HISTORY:  Includes bipolar disorder, unspecified SVT which has  resolved, asthma, carpal tunnel syndrome.   PAST SURGICAL HISTORY:  As noted above, C-sections x2, TAH, appendectomy,  benign tumor removal left breast in the remote past, hemorrhoidectomy,  laparoscopic cholecystectomy.   CURRENT MEDICATIONS:  1.  Zyprexa 5 mg p.o. q.4h. p.r.n.  2.  Wellbutrin 150 mg p.o. t.i.d.  3.  Multivitamin with iron.  4.  Calcium supplements.   ALLERGIES:  1.  PHENERGAN.  2.  CODEINE.  3.  MORPHINE,  4.  Prudy Feeler.   REVIEW OF SYSTEMS:  The patient denies drinking or smoking.  She is does  have a history of a difficult intubation due to anterior and deep  displacement of the vocal cords.   PHYSICAL EXAMINATION:  GENERAL:  The patient is a well-developed, well-  nourished white female in no acute distress.  LUNGS:  Clear to auscultation with equal breath sounds bilaterally.  HEART:  Reveals a regular rate and rhythm without S3, S4, or murmurs.  BREASTS:  Left breast examination reveals well-healed surgical scars in the  left axilla and superior aspect of left breast.   IMPRESSION:  Left  breast carcinoma, unclear margins.   PLAN:  The patient is scheduled for re-excision of the left breast on June 08, 2006.  The risks and benefits of the procedure including bleeding,  infection, and bruising at the wound were fully explained to the patient,  who gave informed consent.      Dalia Heading, M.D.  Electronically Signed     MAJ/MEDQ  D:  06/05/2006  T:  06/05/2006  Job:  [id number]   cc:   Short Stay at [facility] S. Mariel Sleet, MD  Fax: [phone]   Mila Homer. Sudie Bailey, M.D.  Fax: [phone]
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
## 2011-05-01 ENCOUNTER — Ambulatory Visit
Admission: RE | Admit: 2011-05-01 | Discharge: 2011-05-01 | Disposition: A | Discharge status: Home / Self Care | Payer: Managed Care, Other (non HMO) | Source: Ambulatory Visit | Attending: Oncology | Admitting: Oncology

## 2011-05-01 DIAGNOSIS — Z09 Encounter for follow-up examination after completed treatment for conditions other than malignant neoplasm: Secondary | ICD-10-CM

## 2011-06-08 ENCOUNTER — Encounter (INDEPENDENT_AMBULATORY_CARE_PROVIDER_SITE_OTHER): Payer: Self-pay | Admitting: Internal Medicine

## 2011-06-08 ENCOUNTER — Ambulatory Visit (HOSPITAL_COMMUNITY)
Admission: RE | Admit: 2011-06-08 | Payer: Managed Care, Other (non HMO) | Source: Ambulatory Visit | Admitting: Internal Medicine

## 2011-09-01 LAB — COMPREHENSIVE METABOLIC PANEL
Alkaline Phosphatase: 104
BUN: 8
GFR calc non Af Amer: >60
Glucose, Bld: 98
Potassium: 3.6
Total Bilirubin: 0.4
Total Protein: 6.8

## 2011-09-01 LAB — LIPID PANEL
Cholesterol: 244 — ABNORMAL HIGH
LDL Cholesterol: 170 — ABNORMAL HIGH
Triglycerides: 159 — ABNORMAL HIGH

## 2011-09-01 LAB — DIFFERENTIAL
Basophils Absolute: 0.1
Basophils Relative: 1
Monocytes Relative: 6
Neutro Abs: 9.7 — ABNORMAL HIGH
Neutrophils Relative %: 69

## 2011-09-01 LAB — CBC
HCT: 38.8
Hemoglobin: 13.4
RDW: 13.8

## 2011-09-14 ENCOUNTER — Emergency Department (HOSPITAL_COMMUNITY)
Admission: EM | Admit: 2011-09-14 | Discharge: 2011-09-14 | Disposition: A | Discharge status: Home / Self Care | Payer: Managed Care, Other (non HMO) | Attending: Emergency Medicine | Admitting: Emergency Medicine

## 2011-09-14 ENCOUNTER — Emergency Department (HOSPITAL_COMMUNITY): Payer: Managed Care, Other (non HMO)

## 2011-09-14 ENCOUNTER — Encounter: Payer: Self-pay | Admitting: *Deleted

## 2011-09-14 DIAGNOSIS — S93409A Sprain of unspecified ligament of unspecified ankle, initial encounter: Secondary | ICD-10-CM | POA: Insufficient documentation

## 2011-09-14 DIAGNOSIS — W010XXA Fall on same level from slipping, tripping and stumbling without subsequent striking against object, initial encounter: Secondary | ICD-10-CM | POA: Insufficient documentation

## 2011-09-14 DIAGNOSIS — Z79899 Other long term (current) drug therapy: Secondary | ICD-10-CM | POA: Insufficient documentation

## 2011-09-14 DIAGNOSIS — F17200 Nicotine dependence, unspecified, uncomplicated: Secondary | ICD-10-CM | POA: Insufficient documentation

## 2011-09-14 DIAGNOSIS — M2550 Pain in unspecified joint: Secondary | ICD-10-CM | POA: Insufficient documentation

## 2011-09-14 HISTORY — DX: Bipolar disorder, unspecified: F31.9

## 2011-09-14 MED ORDER — HYDROCODONE-ACETAMINOPHEN 5-325 MG PO TABS
2.0000 | ORAL_TABLET | Freq: Once | ORAL | Status: AC
  Administered 2011-09-14: 2 via ORAL
  Filled 2011-09-14: qty 2

## 2011-09-14 MED ORDER — IBUPROFEN 800 MG PO TABS: 800.0000 mg | ORAL_TABLET | Freq: Three times a day (TID) | ORAL | Status: AC

## 2011-09-14 MED ORDER — HYDROCODONE-ACETAMINOPHEN 7.5-325 MG PO TABS: ORAL_TABLET | ORAL | Status: DC

## 2011-09-14 NOTE — ED Notes (Signed)
Pt reports she tripped over her puppy and rolled her left ankle, pt able to ambulate

## 2011-09-14 NOTE — ED Notes (Signed)
Patient has had xray done and awaiting on results

## 2011-09-14 NOTE — ED Provider Notes (Signed)
History     CSN: [id number] Arrival date & time: 09/14/2011  7:22 PM  Chief Complaint  Patient presents with  . Ankle Pain    (Consider location/radiation/quality/duration/timing/severity/associated sxs/prior treatment) HPI Comments: Patient c/o pain to her left ankle.  States she tripped over her dog and twisted her left ankle.  She denies numbness, weakness or other injuries.    Patient is a 50 y.o. female presenting with ankle pain. The history is provided by the patient.  Ankle Pain  The incident occurred 3 to 5 hours ago. The incident occurred at home. The injury mechanism was a fall and torsion. The pain is present in the left ankle. The quality of the pain is described as throbbing and aching. The pain is moderate. The pain has been constant since onset. Pertinent negatives include no numbness, no inability to bear weight, no loss of motion, no muscle weakness, no loss of sensation and no tingling. She reports no foreign bodies present. The symptoms are aggravated by activity, bearing weight and palpation. She has tried nothing for the symptoms. The treatment provided no relief.    Past Medical History  Diagnosis Date  . Bipolar 1 disorder     Past Surgical History  Procedure Date  . Abdominal hysterectomy   . Cholecystectomy   . Cesarean section   . Appendectomy     History reviewed. No pertinent family history.  History  Substance Use Topics  . Smoking status: Current Everyday Smoker    Types: Cigarettes  . Smokeless tobacco: Not on file  . Alcohol Use: No    OB History    Grav Para Term Preterm Abortions TAB SAB Ect Mult Living                  Review of Systems  Constitutional: Negative for fever, chills and fatigue.  HENT: Negative for sore throat, trouble swallowing, neck pain and neck stiffness.   Respiratory: Negative for cough, shortness of breath and wheezing.   Cardiovascular: Negative for chest pain and palpitations.  Genitourinary: Negative for  dysuria, hematuria and flank pain.  Musculoskeletal: Positive for arthralgias. Negative for myalgias, back pain and joint swelling.  Skin: Negative for rash and wound.  Neurological: Negative for dizziness, tingling, weakness and numbness.  Hematological: Does not bruise/bleed easily.  All other systems reviewed and are negative.    Allergies  Codeine; Morphine; Promethazine hcl; and Alprazolam  Home Medications   Current Outpatient Rx  Name Route Sig Dispense Refill  . ACETAMINOPHEN 500 MG PO TABS Oral Take 500-1,000 mg by mouth as needed. For pain     . ARIPIPRAZOLE 10 MG PO TABS Oral Take 10 mg by mouth daily.      . BUPROPION HCL (XL) 150 MG PO TB24 Oral Take 150 mg by mouth 2 (two) times daily.      Marland Kitchen CITALOPRAM HYDROBROMIDE 20 MG PO TABS Oral Take 20 mg by mouth daily.      . IBUPROFEN 200 MG PO TABS Oral Take 200 mg by mouth as needed. For pain     . LORAZEPAM 1 MG PO TABS Oral Take 1 mg by mouth every 8 (eight) hours as needed. For anxiety       BP 128/76  Pulse 81  Temp(Src) 98.5 F (36.9 C) (Oral)  Resp 14  Ht 5\' 9"  (1.753 m)  Wt 199 lb (90.266 kg)  BMI 29.39 kg/m2  SpO2 97%  Physical Exam  Nursing note and vitals reviewed. Constitutional: She is  oriented to person, place, and time. Vital signs are normal. She appears well-developed and well-nourished. No distress.  HENT:  Head: Normocephalic and atraumatic.  Mouth/Throat: Oropharynx is clear and moist.  Neck: Normal range of motion. Neck supple.  Cardiovascular: Normal rate, regular rhythm and normal heart sounds.   Pulmonary/Chest: Effort normal and breath sounds normal. No respiratory distress. She exhibits no tenderness.  Musculoskeletal: She exhibits edema and tenderness.       Left ankle: She exhibits decreased range of motion and swelling. She exhibits no deformity, no laceration and normal pulse. tenderness. Lateral malleolus tenderness found. No medial malleolus, no head of 5th metatarsal and no proximal  fibula tenderness found. Achilles tendon normal.  Lymphadenopathy:    She has no cervical adenopathy.  Neurological: She is alert and oriented to person, place, and time. No cranial nerve deficit. She exhibits normal muscle tone. Coordination normal.  Skin: Skin is warm and dry.     ED Course  ORTHOPEDIC INJURY TREATMENT Date/Time: 09/14/2011 9:19 PM Performed by: Trisha Mangle, Siddhanth Denk L. Authorized by: Glynn Octave Consent: Verbal consent obtained. Written consent not obtained. Risks and benefits: risks, benefits and alternatives were discussed Consent given by: patient Patient understanding: patient states understanding of the procedure being performed Patient consent: the patient's understanding of the procedure matches consent given Procedure consent: procedure consent matches procedure scheduled Imaging studies: imaging studies available Patient identity confirmed: verbally with patient Time out: Immediately prior to procedure a "time out" was called to verify the correct patient, procedure, equipment, support staff and site/side marked as required. Injury location: ankle Location details: left ankle Injury type: soft tissue Pre-procedure distal perfusion: normal Pre-procedure neurological function: normal Pre-procedure range of motion: reduced Local anesthesia used: no Patient sedated: no Immobilization: splint and crutches Post-procedure neurovascular assessment: post-procedure neurovascularly intact Post-procedure distal perfusion: normal Post-procedure neurological function: normal Post-procedure range of motion: unchanged Patient tolerance: Patient tolerated the procedure well with no immediate complications.   (including critical care time)   Dg Ankle Complete Left  09/14/2011  *RADIOLOGY REPORT*  Clinical Data: Tripped on stairs, injuring the left lateral ankle  LEFT ANKLE COMPLETE - 3+ VIEW  Comparison: None.  Findings: No fracture dislocation.  Ankle mortise is  preserved. Regional soft tissues are normal.  Several small accessory ossicles are noted adjacent to the talonavicular joint, possibly representing an os supranaviculare.  IMPRESSION: No fracture.  Original Report Authenticated By: Waynard Reeds, M.D.        MDM    ttp of the lateral left ankle.  No defortmity.  DP pulse is strong, CR<2 sec, sensation intact     Ginni Eichler L. Romello Hoehn, [place] 09/15/11 1703

## 2011-09-15 LAB — NOROVIRUS GROUP 1 & 2 BY PCR, STOOL
Norovirus 1 by PCR: NEGATIVE
Norovirus 2  by PCR: NEGATIVE

## 2011-09-16 NOTE — ED Provider Notes (Signed)
Medical screening examination/treatment/procedure(s) were performed by non-physician practitioner and as supervising physician I was immediately available for consultation/collaboration.  Glynn Octave, MD 09/16/11 1120

## 2011-09-27 LAB — DIFFERENTIAL
Basophils Relative: 1
Eosinophils Absolute: 0.2
Eosinophils Relative: 2
Monocytes Absolute: 0.8 — ABNORMAL HIGH
Monocytes Relative: 7

## 2011-09-27 LAB — COMPREHENSIVE METABOLIC PANEL
ALT: 16
AST: 20
Albumin: 4
Alkaline Phosphatase: 99
Chloride: 101
Potassium: 3.6
Sodium: 137
Total Protein: 6.8

## 2011-09-27 LAB — CBC
Platelets: 442 — ABNORMAL HIGH
RDW: 14.1 — ABNORMAL HIGH
WBC: 10.6 — ABNORMAL HIGH

## 2011-10-09 ENCOUNTER — Other Ambulatory Visit (HOSPITAL_COMMUNITY): Payer: Self-pay | Admitting: Oncology

## 2011-10-09 DIAGNOSIS — R921 Mammographic calcification found on diagnostic imaging of breast: Secondary | ICD-10-CM

## 2011-10-24 ENCOUNTER — Ambulatory Visit
Admission: RE | Admit: 2011-10-24 | Discharge: 2011-10-24 | Disposition: A | Discharge status: Home / Self Care | Payer: Managed Care, Other (non HMO) | Source: Ambulatory Visit | Attending: Oncology | Admitting: Oncology

## 2011-10-24 DIAGNOSIS — R921 Mammographic calcification found on diagnostic imaging of breast: Secondary | ICD-10-CM

## 2011-11-01 ENCOUNTER — Ambulatory Visit (HOSPITAL_COMMUNITY)
Admission: RE | Admit: 2011-11-01 | Discharge: 2011-11-01 | Disposition: A | Discharge status: Home / Self Care | Payer: Managed Care, Other (non HMO) | Source: Ambulatory Visit | Attending: Family Medicine | Admitting: Family Medicine

## 2011-11-01 ENCOUNTER — Other Ambulatory Visit (HOSPITAL_COMMUNITY): Payer: Self-pay | Admitting: Family Medicine

## 2011-11-01 DIAGNOSIS — R059 Cough, unspecified: Secondary | ICD-10-CM

## 2011-11-01 DIAGNOSIS — F17200 Nicotine dependence, unspecified, uncomplicated: Secondary | ICD-10-CM | POA: Insufficient documentation

## 2011-11-01 DIAGNOSIS — R05 Cough: Secondary | ICD-10-CM

## 2011-11-01 DIAGNOSIS — J4489 Other specified chronic obstructive pulmonary disease: Secondary | ICD-10-CM | POA: Insufficient documentation

## 2011-11-01 DIAGNOSIS — J449 Chronic obstructive pulmonary disease, unspecified: Secondary | ICD-10-CM | POA: Insufficient documentation

## 2011-12-23 ENCOUNTER — Emergency Department (HOSPITAL_COMMUNITY): Payer: Managed Care, Other (non HMO)

## 2011-12-23 ENCOUNTER — Emergency Department (HOSPITAL_COMMUNITY)
Admission: EM | Admit: 2011-12-23 | Discharge: 2011-12-23 | Disposition: A | Discharge status: Home / Self Care | Payer: Managed Care, Other (non HMO) | Attending: Emergency Medicine | Admitting: Emergency Medicine

## 2011-12-23 ENCOUNTER — Encounter (HOSPITAL_COMMUNITY): Payer: Self-pay | Admitting: Emergency Medicine

## 2011-12-23 DIAGNOSIS — J449 Chronic obstructive pulmonary disease, unspecified: Secondary | ICD-10-CM | POA: Insufficient documentation

## 2011-12-23 DIAGNOSIS — Z9889 Other specified postprocedural states: Secondary | ICD-10-CM | POA: Insufficient documentation

## 2011-12-23 DIAGNOSIS — W19XXXA Unspecified fall, initial encounter: Secondary | ICD-10-CM | POA: Insufficient documentation

## 2011-12-23 DIAGNOSIS — F319 Bipolar disorder, unspecified: Secondary | ICD-10-CM | POA: Insufficient documentation

## 2011-12-23 DIAGNOSIS — Y92009 Unspecified place in unspecified non-institutional (private) residence as the place of occurrence of the external cause: Secondary | ICD-10-CM | POA: Insufficient documentation

## 2011-12-23 DIAGNOSIS — Z9079 Acquired absence of other genital organ(s): Secondary | ICD-10-CM | POA: Insufficient documentation

## 2011-12-23 DIAGNOSIS — J4489 Other specified chronic obstructive pulmonary disease: Secondary | ICD-10-CM | POA: Insufficient documentation

## 2011-12-23 DIAGNOSIS — Z87891 Personal history of nicotine dependence: Secondary | ICD-10-CM | POA: Insufficient documentation

## 2011-12-23 DIAGNOSIS — S42209A Unspecified fracture of upper end of unspecified humerus, initial encounter for closed fracture: Secondary | ICD-10-CM | POA: Insufficient documentation

## 2011-12-23 DIAGNOSIS — S42301A Unspecified fracture of shaft of humerus, right arm, initial encounter for closed fracture: Secondary | ICD-10-CM

## 2011-12-23 HISTORY — DX: Chronic obstructive pulmonary disease, unspecified: J44.9

## 2011-12-23 MED ORDER — HYDROCODONE-ACETAMINOPHEN 5-325 MG PO TABS: 2.0000 | ORAL_TABLET | ORAL | Status: AC | PRN

## 2011-12-23 MED ORDER — IBUPROFEN 800 MG PO TABS
800.0000 mg | ORAL_TABLET | Freq: Once | ORAL | Status: AC
  Administered 2011-12-23: 800 mg via ORAL
  Filled 2011-12-23: qty 1

## 2011-12-23 NOTE — ED Provider Notes (Signed)
History   Scribed for Becky Hutching, MD, the patient was seen in [facility]. The chart was scribed by Gilman Schmidt. The patients care was started at 11:45 AM.   CSN: [id number]  Arrival date & time 12/23/11  0941   First MD Initiated Contact with Patient 12/23/11 1044      Chief Complaint  Patient presents with  . Arm Pain    (Consider location/radiation/quality/duration/timing/severity/associated sxs/prior treatment) HPI Becky Gutierrez is a 51 y.o. female who presents to the Emergency Department complaining of right arm pain. Pt reports falling eight days ago onto polished wood floor. Complains of right upper arm pain with limited ROM that has not improved.  There are no other associated symptoms and no other alleviating or aggravating factors.  PCP: Dr. Sudie Bailey   Past Medical History  Diagnosis Date  . Bipolar 1 disorder   . COPD (chronic obstructive pulmonary disease)     Past Surgical History  Procedure Date  . Abdominal hysterectomy   . Cholecystectomy   . Cesarean section   . Appendectomy     Family History  Problem Relation Age of Onset  . Diabetes Father     History  Substance Use Topics  . Smoking status: Former Smoker -- 0.5 packs/day for 30 years    Types: Cigarettes    Quit date: 12/19/2011  . Smokeless tobacco: Never Used  . Alcohol Use: No    OB History    Grav Para Term Preterm Abortions TAB SAB Ect Mult Living   2 2 2       2       Review of Systems  Musculoskeletal:       Arm pain   All other systems reviewed and are negative.    Allergies  Codeine; Morphine; Promethazine hcl; and Alprazolam  Home Medications   Current Outpatient Rx  Name Route Sig Dispense Refill  . ACETAMINOPHEN 500 MG PO TABS Oral Take 500-1,000 mg by mouth as needed. For pain     . ARIPIPRAZOLE 10 MG PO TABS Oral Take 10 mg by mouth daily.      . BUPROPION HCL ER (XL) 150 MG PO TB24 Oral Take 150 mg by mouth 2 (two) times daily.      Marland Kitchen CITALOPRAM HYDROBROMIDE  20 MG PO TABS Oral Take 20 mg by mouth daily.      Marland Kitchen HYDROCODONE-ACETAMINOPHEN 7.5-325 MG PO TABS  Take one-two tabs po q 4-6 hrs prn pain 20 tablet 0  . IBUPROFEN 200 MG PO TABS Oral Take 200 mg by mouth as needed. For pain     . LORAZEPAM 1 MG PO TABS Oral Take 1 mg by mouth every 8 (eight) hours as needed. For anxiety       BP 150/89  Pulse 69  Temp(Src) 98 F (36.7 C) (Oral)  Resp 16  Ht 5\' 9"  (1.753 m)  Wt 196 lb (88.905 kg)  BMI 28.94 kg/m2  SpO2 98%  Physical Exam  Constitutional: She is oriented to person, place, and time. She appears well-developed and well-nourished.  Non-toxic appearance. She does not have a sickly appearance.  HENT:  Head: Normocephalic and atraumatic.  Eyes: Conjunctivae, EOM and lids are normal. Pupils are equal, round, and reactive to light. No scleral icterus.  Neck: Trachea normal and normal range of motion. Neck supple.  Cardiovascular: Regular rhythm and normal heart sounds.   Pulmonary/Chest: Effort normal and breath sounds normal.  Abdominal: Soft. Normal appearance. There is no tenderness. There is no  rebound, no guarding and no CVA tenderness.  Musculoskeletal: Normal range of motion.       Right upper arm: She exhibits tenderness.  Neurological: She is alert and oriented to person, place, and time. She has normal strength.  Skin: Skin is warm, dry and intact. No rash noted.    ED Course  Procedures (including critical care time)   No diagnosis found.  DIAGNOSTIC STUDIES Oxygen Saturation is 98% on room air, normal by my interpretation.    Radiology: DG Shoulder Right. Reviewed by me. IMPRESSION: Avulsion injury of the greater tuberosity. Original Report Authenticated By: Patterson Hammersmith, M.D.   COORDINATION OF CARE: 11:15am:  - Patient evaluated by ED physician, DG Shoulder, Ibuprofen ordered. XR results reviewed.      MDM  X-ray shows avulsion fracture of right greater humeral tuberosity. Sling, ice, pain medication,  referral to orthopedics.   I personally performed the services described in this documentation, which was scribed in my presence. The recorded information has been reviewed and considered.       Becky Hutching, MD 12/23/11 1248

## 2011-12-23 NOTE — ED Notes (Signed)
Patient reports falling x8 days ago on polished floor. Patient c/o right upper arm pain with limited ROM that has not gotten any better.

## 2011-12-23 NOTE — ED Notes (Signed)
MD at bedside. Dr Adriana Simas at bedside, examined pt and discussed plan of care.

## 2011-12-26 ENCOUNTER — Encounter: Payer: Self-pay | Admitting: Orthopedic Surgery

## 2011-12-26 ENCOUNTER — Ambulatory Visit (INDEPENDENT_AMBULATORY_CARE_PROVIDER_SITE_OTHER): Payer: Managed Care, Other (non HMO) | Admitting: Orthopedic Surgery

## 2011-12-26 VITALS — Ht 69.0 in | Wt 196.0 lb

## 2011-12-26 DIAGNOSIS — S42253A Displaced fracture of greater tuberosity of unspecified humerus, initial encounter for closed fracture: Secondary | ICD-10-CM

## 2011-12-26 MED ORDER — HYDROCODONE-APAP-DIETARY PROD 10-325 MG PO MISC: 1.0000 | ORAL | Status: DC | PRN

## 2011-12-26 NOTE — Patient Instructions (Signed)
OOW 15TH JAN + 10 DAYS   F/U 10 DAYS

## 2012-01-03 ENCOUNTER — Encounter: Payer: Self-pay | Admitting: Orthopedic Surgery

## 2012-01-03 DIAGNOSIS — S42253A Displaced fracture of greater tuberosity of unspecified humerus, initial encounter for closed fracture: Secondary | ICD-10-CM | POA: Insufficient documentation

## 2012-01-03 NOTE — Progress Notes (Signed)
Patient ID: Becky Gutierrez, female   DOB: [birth year], 51 y.o.   MRN: [id number]  Pain RIGHT humerus with fracture  Patient is hurting over the RIGHT upper arm Pain started in January 4 Injury Treatment in the emergency room x-ray showed proximal humerus fracture, hydrocodone 5 mg take q.4 hours p.r.n.  Symptoms sharp stabbing pain Pain scale 8 Timing constant Improved with pain medication Worse with motion Associated symptoms include numbness tingling catching swelling  Review of systems fatigue cough snoring vomiting urgency numbness tingling anxiety depression denies chest pain skin difficulties hematologic problems endocrine issues of thirst urination or temperature intolerance denies seasonal ALLERGIES  Past Medical History  Diagnosis Date  . Bipolar 1 disorder   . COPD (chronic obstructive pulmonary disease)     Past Surgical History  Procedure Date  . Abdominal hysterectomy   . Cholecystectomy   . Cesarean section   . Appendectomy    Examination Physical Exam(12) GENERAL: normal development   CDV: pulses are normal   Skin: normal  Lymph: nodes were not palpable/normal  Psychiatric: awake, alert and oriented  Neuro: normal sensation  MSK Cervical spine normal 1 Lower extremity exam  Ambulation is normal.  Inspection and palpation revealed no tenderness or abnormality in alignment in the lower extremities. Range of motion is full.  Strength is grade 5.  And all joints are stable.  2 RIGHT upper extremity: Inspection tenderness is noted proximally.  Range of motion deficits are noted secondary to pain.  Strength assessment normal muscle tone.  Stability test deferred secondary to pain 3 LEFT upper extremity: There is no contracture subluxation atrophy tremor or malalignment.  4 X-ray Hospital dated December 23, 2011.  Avulsion fracture greater tuberosity.  Assessment: Greater tuberosity avulsion injury/fracture    Plan: Sling repeat x-ray in several  weeks.

## 2012-01-09 ENCOUNTER — Telehealth: Payer: Self-pay | Admitting: *Deleted

## 2012-01-09 ENCOUNTER — Encounter: Payer: Self-pay | Admitting: Orthopedic Surgery

## 2012-01-09 ENCOUNTER — Ambulatory Visit (INDEPENDENT_AMBULATORY_CARE_PROVIDER_SITE_OTHER): Payer: Managed Care, Other (non HMO) | Admitting: Orthopedic Surgery

## 2012-01-09 VITALS — Ht 69.0 in | Wt 196.0 lb

## 2012-01-09 DIAGNOSIS — S42253A Displaced fracture of greater tuberosity of unspecified humerus, initial encounter for closed fracture: Secondary | ICD-10-CM

## 2012-01-09 MED ORDER — HYDROCODONE-ACETAMINOPHEN 7.5-325 MG PO TABS: 1.0000 | ORAL_TABLET | ORAL | Status: AC | PRN

## 2012-01-09 NOTE — Patient Instructions (Signed)
Remove sling   No active abduction   OOW 4 weeks   Start OT this week call the hospital to get appointment

## 2012-01-09 NOTE — Progress Notes (Signed)
Patient ID: Becky Gutierrez, female   DOB: 04-07-1961, 51 y.o.   MRN: [id number]   2 weeks after fracture, RIGHT greater tuberosity of the shoulder.  Injury date December 15, 2011  Her pain level today 6/10.  Her medication hydrocodone 10.  External rotation passively normal passive abduction, 90.  Start physical therapy, removed swelling, but no active abduction x4 weeks.  Continue out of work 4 weeks.

## 2012-01-09 NOTE — Telephone Encounter (Signed)
Message copied by Diamantina Monks on Tue Jan 09, 2012 10:03 AM ------      Message from: Cammie Sickle A      Created: Tue Jan 09, 2012  9:57 AM      Regarding: PT/OT order needed, not in system yet       Marijean Niemann,       If an order for occup therapy can be entered, for RT shoulder, for:      Sequita Wise #[id number].  She'd like to go to [facility].      Thanks,Carol

## 2012-01-15 ENCOUNTER — Inpatient Hospital Stay (HOSPITAL_COMMUNITY)
Admission: RE | Admit: 2012-01-15 | Payer: Managed Care, Other (non HMO) | Source: Ambulatory Visit | Admitting: Occupational Therapy

## 2012-01-17 ENCOUNTER — Ambulatory Visit (HOSPITAL_COMMUNITY): Payer: Managed Care, Other (non HMO) | Admitting: Occupational Therapy

## 2012-02-06 ENCOUNTER — Encounter: Payer: Self-pay | Admitting: Orthopedic Surgery

## 2012-02-06 ENCOUNTER — Ambulatory Visit (INDEPENDENT_AMBULATORY_CARE_PROVIDER_SITE_OTHER): Payer: Managed Care, Other (non HMO) | Admitting: Orthopedic Surgery

## 2012-02-06 VITALS — BP 134/90 | Ht 69.0 in | Wt 187.8 lb

## 2012-02-06 DIAGNOSIS — S42253A Displaced fracture of greater tuberosity of unspecified humerus, initial encounter for closed fracture: Secondary | ICD-10-CM | POA: Insufficient documentation

## 2012-02-06 NOTE — Progress Notes (Signed)
X-ray report  2 views RIGHT shoulder  February 06, 2012  RIGHT greater tuberosity fracture followup  The greater tuberosity fracture is nondisplaced.  Fracture line is still visible but no displacement has occurred since the original film  Impression healed RIGHT greater tuberosity fracture

## 2012-02-06 NOTE — Progress Notes (Signed)
Subjective:     Patient ID: Becky Gutierrez, female   DOB: 17-Aug-1961, 51 y.o.   MRN: [id number]  HPI Chief Complaint  Patient presents with  . Follow-up    4 week recheck and xray fracture of Gt Rt humerus, DOI 12/15/11   RIGHT greater tuberosity fracture 6 weeks ago repeat x-ray today  Complaint of continued pain      Review of Systems No numbness or tingling just pain full forward elevation of the RIGHT upper extremity    Objective:   Physical Exam Neurovascular exam is intact.  Tenderness is noted over the greater tuberosity painful forward elevation 120 painful arc of motion 120-150 pain with supraspinatus resistance and mild weakness to manual muscle testing.  Shoulder stable      Assessment:     X-ray was obtained greater tuberosity fracture healed with no displacement  Continued pain RIGHT shoulder patient did not anticipate in physical therapy because of financial    Plan:      difficulties one visit to physical therapy home exercise program return in 4 weeks.  Continue out of work 4 weeks due to lifting requirements at work

## 2012-03-05 ENCOUNTER — Encounter: Payer: Self-pay | Admitting: Orthopedic Surgery

## 2012-03-05 ENCOUNTER — Ambulatory Visit (INDEPENDENT_AMBULATORY_CARE_PROVIDER_SITE_OTHER): Payer: Managed Care, Other (non HMO) | Admitting: Orthopedic Surgery

## 2012-03-05 VITALS — BP 98/62 | Ht 69.0 in | Wt 187.0 lb

## 2012-03-05 DIAGNOSIS — S42253A Displaced fracture of greater tuberosity of unspecified humerus, initial encounter for closed fracture: Secondary | ICD-10-CM

## 2012-03-05 NOTE — Patient Instructions (Signed)
RTW 03-11-2012

## 2012-03-05 NOTE — Progress Notes (Signed)
Patient ID: Becky Gutierrez, female   DOB: 03-14-1961, 51 y.o.   MRN: [id number] Chief Complaint  Patient presents with  . Follow-up    4 week recheck on right shoulder.    Proximal humerus fracture.  Home exercise program was performed by the patient.  She has full range of motion excellent strength. She can return to work on April 1

## 2012-06-16 ENCOUNTER — Encounter (HOSPITAL_COMMUNITY): Payer: Self-pay

## 2012-06-16 ENCOUNTER — Emergency Department (HOSPITAL_COMMUNITY)
Admission: EM | Admit: 2012-06-16 | Discharge: 2012-06-16 | Disposition: A | Discharge status: Home / Self Care | Payer: Managed Care, Other (non HMO) | Attending: Emergency Medicine | Admitting: Emergency Medicine

## 2012-06-16 ENCOUNTER — Emergency Department (HOSPITAL_COMMUNITY): Payer: Managed Care, Other (non HMO)

## 2012-06-16 DIAGNOSIS — F319 Bipolar disorder, unspecified: Secondary | ICD-10-CM | POA: Insufficient documentation

## 2012-06-16 DIAGNOSIS — F17200 Nicotine dependence, unspecified, uncomplicated: Secondary | ICD-10-CM | POA: Insufficient documentation

## 2012-06-16 DIAGNOSIS — J449 Chronic obstructive pulmonary disease, unspecified: Secondary | ICD-10-CM | POA: Insufficient documentation

## 2012-06-16 DIAGNOSIS — J4489 Other specified chronic obstructive pulmonary disease: Secondary | ICD-10-CM | POA: Insufficient documentation

## 2012-06-16 DIAGNOSIS — S86912A Strain of unspecified muscle(s) and tendon(s) at lower leg level, left leg, initial encounter: Secondary | ICD-10-CM

## 2012-06-16 DIAGNOSIS — W010XXA Fall on same level from slipping, tripping and stumbling without subsequent striking against object, initial encounter: Secondary | ICD-10-CM | POA: Insufficient documentation

## 2012-06-16 DIAGNOSIS — IMO0002 Reserved for concepts with insufficient information to code with codable children: Secondary | ICD-10-CM | POA: Insufficient documentation

## 2012-06-16 NOTE — ED Notes (Signed)
Pt states she fell yesterday on her porch. Complain of knee pain today

## 2012-06-16 NOTE — ED Provider Notes (Signed)
History    This chart was scribed for Osvaldo Human, MD, MD by Smitty Pluck. The patient was seen in room [facility]12 and the patient's care was started at 7:25AM.   CSN: [id number]  Arrival date & time 06/16/12  1308   First MD Initiated Contact with Patient 06/16/12 [phone]      Chief Complaint  Patient presents with  . Knee Pain    (Consider location/radiation/quality/duration/timing/severity/associated sxs/prior treatment) Patient is a 51 y.o. female presenting with knee pain. The history is provided by the patient.  Knee Pain   Becky Gutierrez is a 51 y.o. female who presents to the Emergency Department complaining of moderate left knee pain due to fall onset 1 day ago. Pt reports that she was walking to front door and slipped on wood floors. Pt reports that she landed on her left knee. She reports that she had nausea, diaphoresis and she felt she would pass out. Pt reports that her BP was 88/38 and then returned to 90/61. She reports taking tylenol without relief. Pt reports pain is constant since onset without radiation. Pt reports pain with movement of left leg. Pt denies any other health complications. Pt reports hx of cholecystectomy and total hysterectomy. Pt reports 1 pack/day.   Past Medical History  Diagnosis Date  . Bipolar 1 disorder   . COPD (chronic obstructive pulmonary disease)     Past Surgical History  Procedure Date  . Abdominal hysterectomy   . Cholecystectomy   . Cesarean section   . Appendectomy     Family History  Problem Relation Age of Onset  . Diabetes Father     History  Substance Use Topics  . Smoking status: Current Everyday Smoker -- 0.5 packs/day for 30 years    Types: Cigarettes    Last Attempt to Quit: 12/19/2011  . Smokeless tobacco: Never Used  . Alcohol Use: No    OB History    Grav Para Term Preterm Abortions TAB SAB Ect Mult Living   2 2 2       2       Review of Systems  All other systems reviewed and are negative.   10  Systems reviewed and all are negative for acute change except as noted in the HPI.   Allergies  Codeine; Morphine; Promethazine hcl; and Alprazolam  Home Medications   Current Outpatient Rx  Name Route Sig Dispense Refill  . ACETAMINOPHEN 500 MG PO TABS Oral Take 500-1,000 mg by mouth as needed. For pain     . ARIPIPRAZOLE 10 MG PO TABS Oral Take 10 mg by mouth daily.      . BUPROPION HCL ER (XL) 150 MG PO TB24 Oral Take 150 mg by mouth 2 (two) times daily.      Marland Kitchen CITALOPRAM HYDROBROMIDE 20 MG PO TABS Oral Take 20 mg by mouth daily.      Marland Kitchen HYDROCODONE-APAP-DIETARY PROD 10-325 MG PO MISC Oral Take 1 tablet by mouth every 4 (four) hours as needed. 60 each 0  . IBUPROFEN 200 MG PO TABS Oral Take 200 mg by mouth as needed. For pain     . LORAZEPAM 1 MG PO TABS Oral Take 1 mg by mouth every 8 (eight) hours as needed. For anxiety       BP 116/80  Pulse 86  Temp 98.1 F (36.7 C) (Oral)  Resp 16  Ht 5\' 9"  (1.753 m)  Wt 190 lb (86.183 kg)  BMI 28.06 kg/m2  SpO2 97%  Physical Exam  Nursing note and vitals reviewed. Constitutional: She is oriented to person, place, and time. She appears well-developed and well-nourished. No distress.  HENT:  Head: Normocephalic and atraumatic.  Eyes: EOM are normal. Pupils are equal, round, and reactive to light.  Neck: Normal range of motion. Neck supple. No tracheal deviation present.  Cardiovascular: Normal rate.   Pulmonary/Chest: Effort normal. No respiratory distress.  Abdominal: Soft. She exhibits no distension.  Musculoskeletal:       She localizes pain to the medial side of the left knee.  There is no palpable bony deformity.  There is tenderness to palpation over the medial collateral ligament.  There is no effusion and no ligamentous instability.  She has intact sensation and tendon function in the left foot.   Neurological: She is alert and oriented to person, place, and time.  Skin: Skin is warm and dry.  Psychiatric: She has a normal  mood and affect. Her behavior is normal.    ED Course  Procedures (including critical care time) DIAGNOSTIC STUDIES: Oxygen Saturation is 97% on room air, normal by my interpretation.    COORDINATION OF CARE: 7:33AM EDP discusses pt ED treatment with pt.  8:08AM EDP rechecks pt and discusses pt imaging results. Dg Knee Complete 4 Views Left  06/16/2012  *RADIOLOGY REPORT*  Clinical Data: Larey Seat yesterday and injured left knee.  Medial knee pain.  LEFT KNEE - COMPLETE 4+ VIEW  Comparison: None.  Findings: No evidence of acute, subacute, or healed fractures. Well-preserved joint spaces.  No intrinsic osseous abnormalities. No evidence of a significant joint effusion.  Note is made of soft tissue swelling medially.  IMPRESSION: No osseous abnormality.  Original Report Authenticated By: Arnell Sieving, M.D.    DISP:  Advised Ice, knee immobilizer, rest.  No work for 2 days.  She did not want pain medications.    1. Strain of left knee and leg       I personally performed the services described in this documentation, which was scribed in my presence. The recorded information has been reviewed and considered.  Osvaldo Human, M.D.      Becky Cooper III, MD 06/16/12 [phone]

## 2012-06-16 NOTE — ED Notes (Signed)
Pt c/o pain with movement in her left knee. Pt states that she fell yesterday. No swelling noted. Pt alert and oriented x 3. Skin warm and dry. Color pink. Strong pulses noted in left foot.

## 2012-07-23 ENCOUNTER — Encounter: Payer: Self-pay | Admitting: Orthopedic Surgery

## 2012-07-23 ENCOUNTER — Ambulatory Visit (INDEPENDENT_AMBULATORY_CARE_PROVIDER_SITE_OTHER): Payer: Managed Care, Other (non HMO) | Admitting: Orthopedic Surgery

## 2012-07-23 VITALS — BP 106/60 | Ht 69.0 in | Wt 194.6 lb

## 2012-07-23 DIAGNOSIS — S83419A Sprain of medial collateral ligament of unspecified knee, initial encounter: Secondary | ICD-10-CM

## 2012-07-23 DIAGNOSIS — IMO0002 Reserved for concepts with insufficient information to code with codable children: Secondary | ICD-10-CM

## 2012-07-23 DIAGNOSIS — S83207A Unspecified tear of unspecified meniscus, current injury, left knee, initial encounter: Secondary | ICD-10-CM | POA: Insufficient documentation

## 2012-07-23 NOTE — Progress Notes (Signed)
  Subjective:    Patient ID: Becky Gutierrez, female    DOB: [birth year], 51 y.o.   MRN: [id number] Chief Complaint  Patient presents with  . Knee Injury    left knee pain, DOI 06/15/12    HPI Comments: The patient was injured on July 6 with a valgus load to the left knee landing on a hard floor. She now complains of sharp burning 6/10 pain which is constant. She wore a soft brace for the last 4 weeks but is having locking and catching episodes with one bad giving out episode of the left knee and in several near misses. Pain is 6/10 pain is constant pain is worse with bending pain is medial  Patient's review of systems indicates she has a history of a cough and musculoskeletal complaints as noted with no other positive findings  Medical history has been reviewed     HPI Comments: The patient was injured on July 6 with a valgus load to the left knee landing on a hard floor. She now complains of sharp burning 6/10 pain which is constant. She wore a soft brace for the last 4 weeks but is having locking and catching episodes with one bad giving out episode of the left knee and in several near misses. Pain is 6/10 pain is constant pain is worse with bending pain is medial  Patient's review of systems indicates she has a history of a cough and musculoskeletal complaints as noted with no other positive findings  Medical history has been reviewed      Review of Systems     Objective:   Physical Exam  Constitutional: She is oriented to person, place, and time. She appears well-developed and well-nourished.  Cardiovascular: Intact distal pulses.   Neurological: She is alert and oriented to person, place, and time. She has normal reflexes. She displays normal reflexes. She exhibits normal muscle tone. Coordination normal.  Skin: Skin is warm and dry.  Psychiatric: She has a normal mood and affect. Her behavior is normal. Judgment and thought content normal.  Right Knee Exam  Right knee exam is  normal.  Tenderness  The patient is experiencing no tenderness.     Range of Motion  The patient has normal right knee ROM.  Muscle Strength   The patient has normal right knee strength.  Tests  McMurray:  Medial - negative  Lachman:  Anterior - negative     Patellar Apprehension: negative  Other  Erythema: absent Sensation: normal Swelling: none   Left Knee Exam   Tenderness  The patient is experiencing tenderness in the medial joint line and MCL.  Range of Motion  The patient has normal left knee ROM.  Muscle Strength   The patient has normal left knee strength.  Tests  McMurray:  Medial - positive Lateral - negative Lachman:  Anterior - negative    Posterior - negative Drawer:       Anterior - negative     Posterior - negative Varus: positive Valgus: negative Patellar Apprehension: negative  Other  Erythema: absent Scars: absent Sensation: normal Swelling: mild     Initial radiographs are normal        Assessment & Plan:  The patient has a possibility of a medial meniscal tear or any MCL sprain or both  Her MCL sprain is still symptomatic  Recommend hinged knee brace  Recommend MRI to determine if surgery needed for meniscal tear  Followup 2 weeks after MR

## 2012-07-23 NOTE — Patient Instructions (Signed)
You have been scheduled for an MRI scan.  Your insurance company requires a precertification prior to scheduling the MRI.  If the MRI scan is not approved we will let you know and make further treatment recommendations according to your insurance's guidelines.     We will schedule you for another  appointment to review the results and make further treatment recommendations  Wear brace

## 2012-07-29 ENCOUNTER — Telehealth: Payer: Self-pay | Admitting: Radiology

## 2012-07-29 NOTE — Telephone Encounter (Signed)
I called to give the patient her MRI at [facility] on 07-30-12 at 1:45. Patient has [employer], no precert is needed. Patient will follow up back in the office for her results.

## 2012-07-30 ENCOUNTER — Ambulatory Visit (HOSPITAL_COMMUNITY)
Admission: RE | Admit: 2012-07-30 | Discharge: 2012-07-30 | Disposition: A | Discharge status: Home / Self Care | Payer: Managed Care, Other (non HMO) | Source: Ambulatory Visit | Attending: Orthopedic Surgery | Admitting: Orthopedic Surgery

## 2012-07-30 DIAGNOSIS — X58XXXA Exposure to other specified factors, initial encounter: Secondary | ICD-10-CM | POA: Insufficient documentation

## 2012-07-30 DIAGNOSIS — M25569 Pain in unspecified knee: Secondary | ICD-10-CM | POA: Insufficient documentation

## 2012-07-30 DIAGNOSIS — R937 Abnormal findings on diagnostic imaging of other parts of musculoskeletal system: Secondary | ICD-10-CM | POA: Insufficient documentation

## 2012-07-30 DIAGNOSIS — S83207A Unspecified tear of unspecified meniscus, current injury, left knee, initial encounter: Secondary | ICD-10-CM

## 2012-07-30 DIAGNOSIS — S83419A Sprain of medial collateral ligament of unspecified knee, initial encounter: Secondary | ICD-10-CM

## 2012-08-01 ENCOUNTER — Telehealth: Payer: Self-pay | Admitting: Orthopedic Surgery

## 2012-08-01 NOTE — Telephone Encounter (Deleted)
IMPRESSION:  1. Grade II MCL ligament injury. 2. Lateral femoral and medial tibial bone contusions with trabecular microfracture pattern but no discrete fracture line. 3. Intact anterior and posterior cruciate ligaments and intact menisci. 4. Mild tricompartmental degenerative chondrosis/chondromalacia.

## 2012-08-01 NOTE — Telephone Encounter (Signed)
IMPRESSION:  1. Grade II MCL ligament injury. 2. Lateral femoral and medial tibial bone contusions with trabecular microfracture pattern but no discrete fracture line. 3. Intact anterior and posterior cruciate ligaments and intact menisci. 4. Mild tricompartmental degenerative chondrosis/chondromalacia.    Results will be discussed with the patient at her next visit she will need a brace no surgery

## 2012-08-06 ENCOUNTER — Ambulatory Visit (INDEPENDENT_AMBULATORY_CARE_PROVIDER_SITE_OTHER): Payer: Managed Care, Other (non HMO) | Admitting: Orthopedic Surgery

## 2012-08-06 ENCOUNTER — Encounter: Payer: Self-pay | Admitting: Orthopedic Surgery

## 2012-08-06 VITALS — BP 110/70 | Ht 69.0 in | Wt 194.0 lb

## 2012-08-06 DIAGNOSIS — S83419A Sprain of medial collateral ligament of unspecified knee, initial encounter: Secondary | ICD-10-CM

## 2012-08-06 NOTE — Progress Notes (Signed)
Patient ID: Becky Gutierrez, female   DOB: 1961/03/19, 51 y.o.   MRN: [id number] Chief Complaint  Patient presents with  . Results    review MRI left knee    BP 110/70  Ht 5\' 9"  (1.753 m)  Wt 194 lb (87.998 kg)  BMI 28.65 kg/m2  Followup after left knee injury MRI shows grade 2 MCL with bone contusions femur and tibia  Patient says she's getting better. Therefore we will let her resume normal activities with brace protection until her knee stops hurting  There was no meniscal or cruciate ligament damage  Followup as needed  Grade 2 MCL Bone contusion

## 2012-08-06 NOTE — Patient Instructions (Addendum)
Gradual return to normal activity  

## 2012-09-12 ENCOUNTER — Other Ambulatory Visit (HOSPITAL_COMMUNITY): Payer: Self-pay | Admitting: Oncology

## 2012-09-12 DIAGNOSIS — Z853 Personal history of malignant neoplasm of breast: Secondary | ICD-10-CM

## 2013-05-23 ENCOUNTER — Emergency Department (HOSPITAL_COMMUNITY): Payer: Managed Care, Other (non HMO)

## 2013-05-23 ENCOUNTER — Encounter (HOSPITAL_COMMUNITY): Payer: Self-pay | Admitting: *Deleted

## 2013-05-23 ENCOUNTER — Emergency Department (HOSPITAL_COMMUNITY)
Admission: EM | Admit: 2013-05-23 | Discharge: 2013-05-23 | Disposition: A | Discharge status: Home / Self Care | Payer: Managed Care, Other (non HMO) | Attending: Emergency Medicine | Admitting: Emergency Medicine

## 2013-05-23 DIAGNOSIS — Y9311 Activity, swimming: Secondary | ICD-10-CM | POA: Insufficient documentation

## 2013-05-23 DIAGNOSIS — Z853 Personal history of malignant neoplasm of breast: Secondary | ICD-10-CM | POA: Insufficient documentation

## 2013-05-23 DIAGNOSIS — F319 Bipolar disorder, unspecified: Secondary | ICD-10-CM | POA: Insufficient documentation

## 2013-05-23 DIAGNOSIS — Y9239 Other specified sports and athletic area as the place of occurrence of the external cause: Secondary | ICD-10-CM | POA: Insufficient documentation

## 2013-05-23 DIAGNOSIS — S5291XA Unspecified fracture of right forearm, initial encounter for closed fracture: Secondary | ICD-10-CM

## 2013-05-23 DIAGNOSIS — Y92838 Other recreation area as the place of occurrence of the external cause: Secondary | ICD-10-CM | POA: Insufficient documentation

## 2013-05-23 DIAGNOSIS — S52599A Other fractures of lower end of unspecified radius, initial encounter for closed fracture: Secondary | ICD-10-CM | POA: Insufficient documentation

## 2013-05-23 DIAGNOSIS — J4489 Other specified chronic obstructive pulmonary disease: Secondary | ICD-10-CM | POA: Insufficient documentation

## 2013-05-23 DIAGNOSIS — Z79899 Other long term (current) drug therapy: Secondary | ICD-10-CM | POA: Insufficient documentation

## 2013-05-23 DIAGNOSIS — J449 Chronic obstructive pulmonary disease, unspecified: Secondary | ICD-10-CM | POA: Insufficient documentation

## 2013-05-23 DIAGNOSIS — F17200 Nicotine dependence, unspecified, uncomplicated: Secondary | ICD-10-CM | POA: Insufficient documentation

## 2013-05-23 DIAGNOSIS — R296 Repeated falls: Secondary | ICD-10-CM | POA: Insufficient documentation

## 2013-05-23 HISTORY — DX: Malignant (primary) neoplasm, unspecified: C80.1

## 2013-05-23 MED ORDER — OXYCODONE-ACETAMINOPHEN 5-325 MG PO TABS
2.0000 | ORAL_TABLET | Freq: Once | ORAL | Status: AC
  Administered 2013-05-23: 2 via ORAL
  Filled 2013-05-23: qty 2

## 2013-05-23 MED ORDER — OXYCODONE-ACETAMINOPHEN 5-325 MG PO TABS: 1.0000 | ORAL_TABLET | ORAL | Status: DC | PRN

## 2013-05-23 NOTE — ED Notes (Signed)
Slipped getting out of pool landing on right arm.  C/o pain/swelling.  Deformity noted in triage.

## 2013-05-23 NOTE — ED Notes (Signed)
Pain rt arm, injury when fell.  Ice pack applied, Alert,

## 2013-05-23 NOTE — ED Provider Notes (Signed)
History     CSN: [id number]  Arrival date & time 05/23/13  1522   First MD Initiated Contact with Patient 05/23/13 1539      Chief Complaint  Patient presents with  . Arm Pain    (Consider location/radiation/quality/duration/timing/severity/associated sxs/prior treatment) HPI Comments: Becky Gutierrez is a 52 y.o. female who presents to the Emergency Department complaining of pain and deformity of the right wrist.  States that she fell onto an outstretched hand while getting out of a swimming pool.  Pain with attempted movement and states "I feel the bones moving and crunching" when she moves her wrist.  She applied ice immediately, she is right hand dominant.  She denies open wound, elbow or shoulder pain, head injury, neck pain or LOC.    Patient is a 52 y.o. female presenting with arm pain. The history is provided by the patient.  Arm Pain Associated symptoms include arthralgias and joint swelling. Pertinent negatives include no chills or fever.    Past Medical History  Diagnosis Date  . Bipolar 1 disorder   . COPD (chronic obstructive pulmonary disease)   . Cancer     breast    Past Surgical History  Procedure Laterality Date  . Abdominal hysterectomy    . Cholecystectomy    . Cesarean section    . Appendectomy    . Hemorroidectomy    . Breast lumpectomy      left side    Family History  Problem Relation Age of Onset  . Diabetes Father   . Heart disease      History  Substance Use Topics  . Smoking status: Current Every Day Smoker -- 0.50 packs/day for 30 years    Types: Cigarettes    Last Attempt to Quit: 12/19/2011  . Smokeless tobacco: Never Used  . Alcohol Use: No    OB History   Grav Para Term Preterm Abortions TAB SAB Ect Mult Living   2 2 2       2       Review of Systems  Constitutional: Negative for fever and chills.  Genitourinary: Negative for dysuria and difficulty urinating.  Musculoskeletal: Positive for joint swelling and arthralgias.   Skin: Negative for color change and wound.  All other systems reviewed and are negative.    Allergies  Codeine; Morphine; Promethazine hcl; and Alprazolam  Home Medications   Current Outpatient Rx  Name  Route  Sig  Dispense  Refill  . acetaminophen (TYLENOL) 500 MG tablet   Oral   Take 500-1,000 mg by mouth as needed. For pain          . ARIPiprazole (ABILIFY) 10 MG tablet   Oral   Take 10 mg by mouth daily.           Marland Kitchen buPROPion (WELLBUTRIN XL) 150 MG 24 hr tablet   Oral   Take 150 mg by mouth 2 (two) times daily.           . citalopram (CELEXA) 20 MG tablet   Oral   Take 20 mg by mouth daily.           . Hydrocodone-APAP-Dietary Prod (HYDROCODONE-APAP-NUTRIT SUPP) 10-325 MG MISC   Oral   Take 1 tablet by mouth every 4 (four) hours as needed.   60 each   0   . ibuprofen (ADVIL,MOTRIN) 200 MG tablet   Oral   Take 200 mg by mouth as needed. For pain          .  LORazepam (ATIVAN) 1 MG tablet   Oral   Take 1 mg by mouth every 8 (eight) hours as needed. For anxiety            BP 109/75  Pulse 84  Temp(Src) 98.1 F (36.7 C) (Oral)  Resp 22  Ht 5\' 9"  (1.753 m)  Wt 190 lb (86.183 kg)  BMI 28.05 kg/m2  SpO2 99%  Physical Exam  Nursing note and vitals reviewed. Constitutional: She is oriented to person, place, and time. She appears well-developed and well-nourished. No distress.  HENT:  Head: Normocephalic and atraumatic.  Cardiovascular: Normal rate, regular rhythm and normal heart sounds.   Pulmonary/Chest: Effort normal and breath sounds normal.  Musculoskeletal: She exhibits edema and tenderness.       Right wrist: She exhibits decreased range of motion, tenderness, bony tenderness and swelling. She exhibits no effusion, no crepitus, no deformity and no laceration.       Arms: ttp of the distal wrist with bony deformity noted.  Pt has full ROM of the fingers,  Radial pulse is brisk, distal sensation intact.  CR< 2 sec.  No bruising, open  wounds, or proximal tenderness  Neurological: She is alert and oriented to person, place, and time. She exhibits normal muscle tone. Coordination normal.  Skin: Skin is warm and dry.    ED Course  Procedures (including critical care time)  Labs Reviewed - No data to display Dg Wrist Complete Right  05/23/2013   *RADIOLOGY REPORT*  Clinical Data: Wrist pain.  Fall.  RIGHT WRIST - COMPLETE 3+ VIEW  Comparison: None.  Findings: A comminuted intrarticular distal radius fracture demonstrates slight dorsal tilt.  The carpal bones are intact. Soft tissue swelling is worse on the ventral side.  The ulna appears to be intact.  IMPRESSION: Comminuted intra-articular fracture of the distal radius with slight dorsal tilt.   Original Report Authenticated By: Marin Roberts, M.D.    Sugar tong splint applied, pain improved, remains NV intact.     MDM   No orthopedic coverage here today.  Will consult hand surgeon.   ttp of the distal right wrist with mild bony deformity, no tenderness proximally.  Compartments are soft, NV intact.     1710  Consulted Dr. Melvyn Novas.  Recommended sugar tong and f/u with the orthopedic physician of her choice on Monday.    Patient has sling at home.  Prefers to f/u with Dr. Romeo Apple on Monday.  She agrees to elevate and apply ice, percocet #24 for pain     Becky Gutierrez L. Trisha Mangle, PA-C 05/23/13 1724

## 2013-05-24 NOTE — ED Provider Notes (Signed)
Medical screening examination/treatment/procedure(s) were performed by non-physician practitioner and as supervising physician I was immediately available for consultation/collaboration.  Brown Dunlap M Christropher Gintz, MD 05/24/13 1302 

## 2013-05-28 ENCOUNTER — Telehealth: Payer: Self-pay | Admitting: Orthopedic Surgery

## 2013-05-28 NOTE — Telephone Encounter (Signed)
Patient called to relay that she needs to cancel upcoming appointment scheduled 06/03/13, due to an accident/new injury - states fell on 05/23/13, and fractured her wrist.  States she was referred to Dr. Bradly Bienenstock, [facility], and has surgery scheduled tomorrow, 05/29/13.  States that Dr. Melvyn Novas is planning to also address her carpal tunnel at time of surgery; therefore, she is requesting copy of NCS from her previous records.  Reviewed having patient sign the appropriate medical records authorization/release of information.  Patient coming in to sign the request.

## 2013-06-03 ENCOUNTER — Ambulatory Visit: Payer: Managed Care, Other (non HMO) | Admitting: Orthopedic Surgery

## 2013-07-08 ENCOUNTER — Other Ambulatory Visit (HOSPITAL_COMMUNITY): Payer: Self-pay | Admitting: Family Medicine

## 2013-07-08 DIAGNOSIS — Z139 Encounter for screening, unspecified: Secondary | ICD-10-CM

## 2013-07-15 ENCOUNTER — Other Ambulatory Visit (HOSPITAL_COMMUNITY): Payer: Managed Care, Other (non HMO)

## 2013-08-06 ENCOUNTER — Ambulatory Visit: Payer: Managed Care, Other (non HMO) | Admitting: Orthopedic Surgery

## 2013-10-09 ENCOUNTER — Encounter (INDEPENDENT_AMBULATORY_CARE_PROVIDER_SITE_OTHER): Payer: Self-pay | Admitting: *Deleted

## 2014-02-05 ENCOUNTER — Ambulatory Visit: Payer: Managed Care, Other (non HMO) | Admitting: Orthopedic Surgery

## 2014-02-12 ENCOUNTER — Encounter: Payer: Self-pay | Admitting: Orthopedic Surgery

## 2014-10-12 ENCOUNTER — Encounter (HOSPITAL_COMMUNITY): Payer: Self-pay | Admitting: *Deleted

## 2015-05-22 ENCOUNTER — Emergency Department (HOSPITAL_COMMUNITY)
Admission: EM | Admit: 2015-05-22 | Discharge: 2015-05-22 | Discharge status: Against Medical Advice | Payer: BLUE CROSS/BLUE SHIELD | Attending: Emergency Medicine | Admitting: Emergency Medicine

## 2015-05-22 ENCOUNTER — Encounter (HOSPITAL_COMMUNITY): Payer: Self-pay | Admitting: Emergency Medicine

## 2015-05-22 DIAGNOSIS — H109 Unspecified conjunctivitis: Secondary | ICD-10-CM | POA: Insufficient documentation

## 2015-05-22 DIAGNOSIS — J449 Chronic obstructive pulmonary disease, unspecified: Secondary | ICD-10-CM | POA: Insufficient documentation

## 2015-05-22 DIAGNOSIS — R05 Cough: Secondary | ICD-10-CM | POA: Insufficient documentation

## 2015-05-22 DIAGNOSIS — Z720 Tobacco use: Secondary | ICD-10-CM | POA: Insufficient documentation

## 2015-05-22 NOTE — ED Notes (Signed)
Notice pink eye to right eye last night. Was treated end of May for 7 days.

## 2015-05-22 NOTE — ED Notes (Signed)
Called patient to recheck vital signs, no answer given. Registration unsure if patient left-will recall for patient in 10 minutes.

## 2015-06-28 ENCOUNTER — Encounter (HOSPITAL_COMMUNITY): Payer: Self-pay | Admitting: Emergency Medicine

## 2015-06-28 ENCOUNTER — Emergency Department (HOSPITAL_COMMUNITY)
Admission: EM | Admit: 2015-06-28 | Discharge: 2015-06-28 | Disposition: A | Discharge status: Home / Self Care | Payer: BLUE CROSS/BLUE SHIELD | Attending: Emergency Medicine | Admitting: Emergency Medicine

## 2015-06-28 DIAGNOSIS — Z79899 Other long term (current) drug therapy: Secondary | ICD-10-CM | POA: Insufficient documentation

## 2015-06-28 DIAGNOSIS — Y93E5 Activity, floor mopping and cleaning: Secondary | ICD-10-CM | POA: Insufficient documentation

## 2015-06-28 DIAGNOSIS — F319 Bipolar disorder, unspecified: Secondary | ICD-10-CM | POA: Insufficient documentation

## 2015-06-28 DIAGNOSIS — S61210A Laceration without foreign body of right index finger without damage to nail, initial encounter: Secondary | ICD-10-CM | POA: Diagnosis not present

## 2015-06-28 DIAGNOSIS — X58XXXA Exposure to other specified factors, initial encounter: Secondary | ICD-10-CM | POA: Insufficient documentation

## 2015-06-28 DIAGNOSIS — Z853 Personal history of malignant neoplasm of breast: Secondary | ICD-10-CM | POA: Diagnosis not present

## 2015-06-28 DIAGNOSIS — Y998 Other external cause status: Secondary | ICD-10-CM | POA: Insufficient documentation

## 2015-06-28 DIAGNOSIS — S6991XA Unspecified injury of right wrist, hand and finger(s), initial encounter: Secondary | ICD-10-CM | POA: Diagnosis present

## 2015-06-28 DIAGNOSIS — Z720 Tobacco use: Secondary | ICD-10-CM | POA: Insufficient documentation

## 2015-06-28 DIAGNOSIS — S61219A Laceration without foreign body of unspecified finger without damage to nail, initial encounter: Secondary | ICD-10-CM

## 2015-06-28 DIAGNOSIS — Y9289 Other specified places as the place of occurrence of the external cause: Secondary | ICD-10-CM | POA: Diagnosis not present

## 2015-06-28 DIAGNOSIS — J449 Chronic obstructive pulmonary disease, unspecified: Secondary | ICD-10-CM | POA: Insufficient documentation

## 2015-06-28 MED ORDER — LIDOCAINE HCL (PF) 2 % IJ SOLN
10.0000 mL | Freq: Once | INTRAMUSCULAR | Status: AC
  Administered 2015-06-28: 10 mL
  Filled 2015-06-28: qty 10

## 2015-06-28 MED ORDER — DIPHTH-ACELL PERTUSSIS-TETANUS 25-58-10 LF-MCG/0.5 IM SUSP
0.5000 mL | Freq: Once | INTRAMUSCULAR | Status: AC
  Administered 2015-06-28: 0.5 mL via INTRAMUSCULAR
  Filled 2015-06-28: qty 0.5

## 2015-06-28 MED ORDER — CEPHALEXIN 500 MG PO CAPS: 500.0000 mg | ORAL_CAPSULE | Freq: Four times a day (QID) | ORAL | Status: DC

## 2015-06-28 NOTE — ED Provider Notes (Signed)
CSN: [id number]     Arrival date & time 06/28/15  1242 History  This chart was scribed for non-physician practitioner, Kem Parkinson, PA-C working with Fredia Sorrow, MD by Tula Nakayama, ED scribe. This patient was seen in room APFT21/APFT21 and the patient's care was started at 1:35 PM   Chief Complaint  Patient presents with  . Extremity Laceration   The history is provided by the patient. No language interpreter was used.    HPI Comments: Becky Gutierrez is a 54 y.o. female who presents to the Emergency Department complaining of a laceration to the distal aspect of her right second finger, with controlled bleeding, that occurred while she was washing her car PTA. Pt reports she was cleaning her muffler when the injury occurred. She notes some grease left in the wound and requests to have the wound irrigated. Pt's Tetanus is out of date. She denies decreased ROM and numbness as associated symptoms. She has not tried any therapies prior to arrival.   Past Medical History  Diagnosis Date  . Bipolar 1 disorder   . COPD (chronic obstructive pulmonary disease)   . Cancer     breast   Past Surgical History  Procedure Laterality Date  . Abdominal hysterectomy    . Cholecystectomy    . Cesarean section    . Appendectomy    . Hemorroidectomy    . Breast lumpectomy      left side   Family History  Problem Relation Age of Onset  . Diabetes Father   . Heart disease     History  Substance Use Topics  . Smoking status: Current Every Day Smoker -- 0.50 packs/day for 30 years    Types: Cigarettes    Last Attempt to Quit: 12/19/2011  . Smokeless tobacco: Never Used  . Alcohol Use: No   OB History    Gravida Para Term Preterm AB TAB SAB Ectopic Multiple Living   2 2 2       2      Review of Systems  Musculoskeletal: Negative for joint swelling and arthralgias.  Skin: Positive for wound.  Neurological: Negative for weakness and numbness.  Hematological: Does not bruise/bleed  easily.  All other systems reviewed and are negative.   Allergies  Codeine; Morphine; Promethazine hcl; and Alprazolam  Home Medications   Prior to Admission medications   Medication Sig Start Date End Date Taking? Authorizing Provider  acetaminophen (TYLENOL) 500 MG tablet Take 500-1,000 mg by mouth as needed. For pain     Historical Provider, MD  ARIPiprazole (ABILIFY) 10 MG tablet Take 10 mg by mouth daily.      Historical Provider, MD  buPROPion (WELLBUTRIN XL) 150 MG 24 hr tablet Take 150 mg by mouth 2 (two) times daily.      Historical Provider, MD  citalopram (CELEXA) 20 MG tablet Take 20 mg by mouth daily.      Historical Provider, MD  Hydrocodone-APAP-Dietary Prod (HYDROCODONE-APAP-NUTRIT SUPP) 10-325 MG MISC Take 1 tablet by mouth every 4 (four) hours as needed. 12/26/11   Carole Civil, MD  ibuprofen (ADVIL,MOTRIN) 200 MG tablet Take 200 mg by mouth as needed. For pain     Historical Provider, MD  LORazepam (ATIVAN) 1 MG tablet Take 1 mg by mouth every 8 (eight) hours as needed. For anxiety     Historical Provider, MD  oxyCODONE-acetaminophen (PERCOCET/ROXICET) 5-325 MG per tablet Take 1 tablet by mouth every 4 (four) hours as needed for pain. 05/23/13  Jaspreet Hollings, PA-C   BP 152/85 mmHg  Pulse 80  Temp(Src) 98.2 F (36.8 C) (Oral)  Resp 18  Ht 5\' 9"  (1.753 m)  Wt 200 lb (90.719 kg)  BMI 29.52 kg/m2  SpO2 99% Physical Exam  Constitutional: She is oriented to person, place, and time. She appears well-developed and well-nourished. No distress.  HENT:  Head: Normocephalic and atraumatic.  Cardiovascular: Normal rate and regular rhythm.   Pulmonary/Chest: Effort normal and breath sounds normal. No respiratory distress.  Musculoskeletal: Normal range of motion. She exhibits no edema.  2 cm superficial laceration to the distal right index finger, no active bleeding; sensation intact; pt has full ROM of the finger; wound is contaminated with what appears to be grease,  no FB's  Neurological: She is alert and oriented to person, place, and time.  Skin: Skin is warm and dry.  Psychiatric: She has a normal mood and affect. Her behavior is normal.  Nursing note and vitals reviewed.   ED Course  Procedures   DIAGNOSTIC STUDIES: Oxygen Saturation is 99% on RA, normal by my interpretation.    COORDINATION OF CARE: 1:39 PM Discussed treatment plan with pt which includes wound care. Pt agreed to plan.  2:27 PM LACERATION REPAIR Performed by: Kem Parkinson, PA-C Consent: Verbal consent obtained. Risks and benefits: risks, benefits and alternatives were discussed Patient identity confirmed: provided demographic data Time out performed prior to procedure Prepped and Draped in normal sterile fashion Wound explored Laceration Location: distal right index finger Laceration Length: 2 cm No Foreign Bodies seen or palpated, wound is contaminated with grease Anesthesia: digital block Local anesthetic: lidocaine 2 % w/o epinephrine Anesthetic total: 2  ml Irrigation method: syringe,  Amount of cleaning: betadine scrub Skin closure: wound was not closed due to infection risk   Patient tolerance: Patient tolerated the procedure well with no immediate complications.  Pt agrees to wound closure by secondary intent  Labs Review Labs Reviewed - No data to display  Imaging Review No results found.   EKG Interpretation None      MDM   Final diagnoses:  Laceration of finger, initial encounter    Wound cleaned and explored.  Remains NV intact, has full ROM .  Dressing applied.  Pt agrees to return here for any sings of infection.    I personally performed the services described in this documentation, which was scribed in my presence. The recorded information has been reviewed and is accurate.    Kem Parkinson, PA-C 06/30/15 0017  Fredia Sorrow, MD 07/14/15 [phone]

## 2015-06-28 NOTE — ED Notes (Signed)
PT obtained laceration to right hand index finger today at 1215 on her car muffler while washing her car. Bleeding controlled at this time.

## 2015-06-28 NOTE — ED Notes (Signed)
finger scrubbed with surgiscrub brush -- pt didn't tolerate well, but continued to keep finger under running water for period of time - Finger dried and PA informed

## 2015-06-28 NOTE — Discharge Instructions (Signed)
Non-Sutured Laceration A laceration is a cut or wound that goes through all layers of the skin and into the tissue just beneath the skin. Usually, these are stitched up or held together with tape or glue shortly after the injury occurred. However, if several or more hours have passed before getting care, too many germs (bacteria) get into the laceration. Stitching it closed would bring the risk of infection. If your health care provider feels your laceration is too old, it may be left open and then bandaged to allow healing from the bottom layer up. HOME CARE INSTRUCTIONS   Change the bandage (dressing) 2 times a day or as directed by your health care provider.  If the dressing or packing gauze sticks, soak it off with soapy water.  When you re-bandage your laceration, make sure that the dressing or packing gauze goes all the way to the bottom of the laceration. The top of the laceration is kept open so it can heal from the bottom up. There is less chance for infection with this method.  Wash the area with soap and water 2 times a day to remove all the creams or ointments, if used. Rinse off the soap. Pat the area dry with a clean towel. Look for signs of infection, such as redness, swelling, or a red line that goes away from the laceration.  Re-apply creams or ointments if they were used to bandage the laceration. This helps keep the bandage from sticking.  If the bandage becomes wet, dirty, or has a bad smell, change it as soon as possible.  Only take medicine as directed by your health care provider. You might need a tetanus shot now if:  You have no idea when you had the last one.  You have never had a tetanus shot before.  Your laceration had dirt in it.  Your laceration was dirty, and your last tetanus shot was more than 7 years ago.  Your laceration was clean, and your last tetanus shot was more than 10 years ago. If you need a tetanus shot, and you decide not to get one, there is  a rare chance of getting tetanus. Sickness from tetanus can be serious. If you got a tetanus shot, your arm may swell and get red and warm to the touch at the shot site. This is common and not a problem. SEEK MEDICAL CARE IF:   You have redness, swelling, or increasing pain in the laceration.  You notice a red line that goes away from your laceration.  You have pus coming from the laceration.  You have a fever.  You notice a bad smell coming from the laceration or dressing.  You notice something coming out of the laceration, such as wood or glass.  Your laceration is on your hand or foot and you are unable to properly move a finger or toe.  You have severe swelling around the laceration, causing pain and numbness.  You notice a change in color in your arm, hand, leg, or foot. MAKE SURE YOU:   Understand these instructions.  Will watch your condition.  Will get help right away if you are not doing well or get worse. Document Released: 10/25/2006 Document Revised: 12/02/2013 Document Reviewed: 05/17/2009 [facility] Patient Information 2015 [place], [place]. This information is not intended to replace advice given to you by your health care provider. Make sure you discuss any questions you have with your health care provider.

## 2015-12-14 ENCOUNTER — Emergency Department (HOSPITAL_COMMUNITY)
Admission: EM | Admit: 2015-12-14 | Discharge: 2015-12-14 | Disposition: A | Discharge status: Home / Self Care | Payer: BLUE CROSS/BLUE SHIELD | Attending: Emergency Medicine | Admitting: Emergency Medicine

## 2015-12-14 ENCOUNTER — Encounter (HOSPITAL_COMMUNITY): Payer: Self-pay | Admitting: Emergency Medicine

## 2015-12-14 ENCOUNTER — Emergency Department (HOSPITAL_COMMUNITY): Payer: BLUE CROSS/BLUE SHIELD

## 2015-12-14 DIAGNOSIS — Y9389 Activity, other specified: Secondary | ICD-10-CM | POA: Diagnosis not present

## 2015-12-14 DIAGNOSIS — S29002A Unspecified injury of muscle and tendon of back wall of thorax, initial encounter: Secondary | ICD-10-CM | POA: Insufficient documentation

## 2015-12-14 DIAGNOSIS — Y9289 Other specified places as the place of occurrence of the external cause: Secondary | ICD-10-CM | POA: Insufficient documentation

## 2015-12-14 DIAGNOSIS — W228XXA Striking against or struck by other objects, initial encounter: Secondary | ICD-10-CM | POA: Insufficient documentation

## 2015-12-14 DIAGNOSIS — J449 Chronic obstructive pulmonary disease, unspecified: Secondary | ICD-10-CM | POA: Insufficient documentation

## 2015-12-14 DIAGNOSIS — Z853 Personal history of malignant neoplasm of breast: Secondary | ICD-10-CM | POA: Diagnosis not present

## 2015-12-14 DIAGNOSIS — Z79899 Other long term (current) drug therapy: Secondary | ICD-10-CM | POA: Diagnosis not present

## 2015-12-14 DIAGNOSIS — F17210 Nicotine dependence, cigarettes, uncomplicated: Secondary | ICD-10-CM | POA: Insufficient documentation

## 2015-12-14 DIAGNOSIS — S299XXA Unspecified injury of thorax, initial encounter: Secondary | ICD-10-CM | POA: Insufficient documentation

## 2015-12-14 DIAGNOSIS — Z8659 Personal history of other mental and behavioral disorders: Secondary | ICD-10-CM | POA: Diagnosis not present

## 2015-12-14 DIAGNOSIS — Y998 Other external cause status: Secondary | ICD-10-CM | POA: Insufficient documentation

## 2015-12-14 DIAGNOSIS — S4991XA Unspecified injury of right shoulder and upper arm, initial encounter: Secondary | ICD-10-CM | POA: Insufficient documentation

## 2015-12-14 DIAGNOSIS — R0789 Other chest pain: Secondary | ICD-10-CM

## 2015-12-14 NOTE — Discharge Instructions (Signed)

## 2015-12-14 NOTE — ED Notes (Signed)
Pt states understanding of care given and follow up instructions 

## 2015-12-14 NOTE — ED Notes (Signed)
Pt fell onto corner of island and injured chest on 12/10/15.  Started having pain that is shooting through to back Saturday morning, still having pain.

## 2015-12-14 NOTE — ED Provider Notes (Signed)
CSN: [id number]     Arrival date & time 12/14/15  2202 History  By signing my name below, I, Becky Gutierrez, attest that this documentation has been prepared under the direction and in the presence of [name], MD. Electronically Signed: Hansel Gutierrez, ED Scribe. 12/14/2015. 10:22 PM.    Chief Complaint  Patient presents with  . Chest Injury   Patient is a 55 y.o. female presenting with chest pain. The history is provided by the patient. No language interpreter was used.  Chest Pain Pain location:  R chest Pain quality: aching   Pain radiates to:  Upper back and R shoulder Pain radiates to the back: yes   Pain severity:  Moderate Onset quality:  Gradual Duration:  4 days Timing:  Constant Progression:  Worsening Chronicity:  New Context: trauma   Relieved by: tylenol, ibuprofen. Worsened by:  Movement Associated symptoms: no fever and no shortness of breath   Risk factors: smoking     HPI Comments: Becky Gutierrez is a 55 y.o. female with h/o breast cancer, COPD, bipolar disorder who presents to the Emergency Department complaining of an injury to her right chest that occurred 4 days ago. Pt states that she fell forward onto her [place], hitting her chest. No LOC, head injury or additional injuries. Pt also notes she woke up the next morning with right shoulder and upper back pain. She states she has taken Tylenol and Ibuprofen with mild to moderate relief of pain. Pt is a current smoker.  She denies SOB, fever, chills.    Past Medical History  Diagnosis Date  . Bipolar 1 disorder ([place])   . COPD (chronic obstructive pulmonary disease) ([place])   . Cancer [facility])     breast   Past Surgical History  Procedure Laterality Date  . Abdominal hysterectomy    . Cholecystectomy    . Cesarean section    . Appendectomy    . Hemorroidectomy    . Breast lumpectomy      left side   Family History  Problem Relation Age of Onset  . Diabetes Father   . Heart disease     Social History   Substance Use Topics  . Smoking status: Current Every Day Smoker -- 0.75 packs/day for 30 years    Types: Cigarettes    Last Attempt to Quit: 12/19/2011  . Smokeless tobacco: Never Used  . Alcohol Use: No   OB History    Gravida Para Term Preterm AB TAB SAB Ectopic Multiple Living   2 2 2       2      Review of Systems  Constitutional: Negative for fever and chills.  Respiratory: Negative for shortness of breath.   Cardiovascular: Positive for chest pain.  Musculoskeletal: Positive for arthralgias.  All other systems reviewed and are negative.  Allergies  Codeine; Morphine; Promethazine hcl; and Alprazolam  Home Medications   Prior to Admission medications   Medication Sig Start Date End Date Taking? Authorizing Provider  acetaminophen (TYLENOL) 500 MG tablet Take 500-1,000 mg by mouth as needed. For pain     Historical Provider, MD  Black Cohosh 540 MG CAPS Take 4 capsules by mouth daily.    Historical Provider, MD  cephALEXin (KEFLEX) 500 MG capsule Take 1 capsule (500 mg total) by mouth 4 (four) times daily. For 7 days 06/28/15   Tammy Triplett, PA-C  cholecalciferol (VITAMIN D) 1000 UNITS tablet Take 2,000 Units by mouth daily.    Historical Provider, MD  ibuprofen (ADVIL,MOTRIN) 200 MG tablet Take 200 mg by mouth as needed. For pain     Historical Provider, MD  Multiple Vitamins-Minerals (CENTRUM SILVER ADULT 50+ PO) Take 1 tablet by mouth daily.    Historical Provider, MD  pyridOXINE (VITAMIN B-6) 100 MG tablet Take 400 mg by mouth daily.    Historical Provider, MD   BP 157/84 mmHg  Pulse 90  Temp(Src) 98.7 F (37.1 C) (Oral)  Resp 20  Ht 5\' 9"  (1.753 m)  Wt 215 lb (97.523 kg)  BMI 31.74 kg/m2  SpO2 100% Physical Exam  Constitutional: She is oriented to person, place, and time. She appears well-developed and well-nourished. No distress.  HENT:  Head: Normocephalic and atraumatic.  Right Ear: External ear normal.  Left Ear: External ear normal.  Nose: Nose  normal.  Eyes: Conjunctivae and EOM are normal. Pupils are equal, round, and reactive to light.  Neck: Normal range of motion. Neck supple.  Pulmonary/Chest: Effort normal.      Musculoskeletal: Normal range of motion.  Neurological: She is alert and oriented to person, place, and time. She exhibits normal muscle tone. Coordination normal.  Skin: Skin is warm and dry.  Psychiatric: She has a normal mood and affect. Her behavior is normal. Thought content normal.  Nursing note and vitals reviewed.   ED Course  Procedures (including critical care time) DIAGNOSTIC STUDIES: Oxygen Saturation is 100% on RA, nrmal by my interpretation.    COORDINATION OF CARE: 10:19 PM Discussed treatment plan with pt at bedside and pt agreed to plan.    Imaging Review Dg Ribs Unilateral W/chest Right  12/14/2015  CLINICAL DATA:  Right-sided rib pain.  Fall. EXAM: RIGHT RIBS AND CHEST - 3+ VIEW COMPARISON:  Radiograph 11/01/2011 FINDINGS: Normal cardiac silhouette. No pulmonary contusion or pleural fluid. No pneumothorax. Dedicated views of the RIGHT ribs demonstrate no displaced fracture IMPRESSION: No evidence of thoracic trauma. No evidence of RIGHT rib fracture or pneumothorax. Electronically Signed   By: Suzy Bouchard M.D.   On: 12/14/2015 23:10   I have personally reviewed and evaluated these images  as part of my medical decision-making.   MDM   Final diagnoses:  Chest wall pain    I personally performed the services described in this documentation, which was scribed in my presence. The recorded information has been reviewed and considered.   [name], MD 12/14/15 [phone]

## 2018-03-13 DIAGNOSIS — F17200 Nicotine dependence, unspecified, uncomplicated: Secondary | ICD-10-CM | POA: Diagnosis not present

## 2018-03-13 DIAGNOSIS — L680 Hirsutism: Secondary | ICD-10-CM | POA: Diagnosis not present

## 2018-03-13 DIAGNOSIS — E2839 Other primary ovarian failure: Secondary | ICD-10-CM | POA: Diagnosis not present

## 2018-03-13 DIAGNOSIS — Z0000 Encounter for general adult medical examination without abnormal findings: Secondary | ICD-10-CM | POA: Diagnosis not present

## 2018-03-13 DIAGNOSIS — E559 Vitamin D deficiency, unspecified: Secondary | ICD-10-CM | POA: Diagnosis not present

## 2018-03-13 DIAGNOSIS — Z0389 Encounter for observation for other suspected diseases and conditions ruled out: Secondary | ICD-10-CM | POA: Diagnosis not present

## 2018-03-13 DIAGNOSIS — R5383 Other fatigue: Secondary | ICD-10-CM | POA: Diagnosis not present

## 2018-03-13 DIAGNOSIS — C50919 Malignant neoplasm of unspecified site of unspecified female breast: Secondary | ICD-10-CM | POA: Diagnosis not present

## 2018-03-13 DIAGNOSIS — E8881 Metabolic syndrome: Secondary | ICD-10-CM | POA: Diagnosis not present

## 2018-04-10 DIAGNOSIS — R5383 Other fatigue: Secondary | ICD-10-CM | POA: Diagnosis not present

## 2018-04-10 DIAGNOSIS — N390 Urinary tract infection, site not specified: Secondary | ICD-10-CM | POA: Diagnosis not present

## 2018-04-10 DIAGNOSIS — A681 Tick-borne relapsing fever: Secondary | ICD-10-CM | POA: Diagnosis not present

## 2018-04-15 DIAGNOSIS — J111 Influenza due to unidentified influenza virus with other respiratory manifestations: Secondary | ICD-10-CM | POA: Diagnosis not present

## 2018-05-02 DIAGNOSIS — E559 Vitamin D deficiency, unspecified: Secondary | ICD-10-CM | POA: Diagnosis not present

## 2018-05-02 DIAGNOSIS — R5383 Other fatigue: Secondary | ICD-10-CM | POA: Diagnosis not present

## 2018-05-02 DIAGNOSIS — E2839 Other primary ovarian failure: Secondary | ICD-10-CM | POA: Diagnosis not present

## 2018-07-04 DIAGNOSIS — E2839 Other primary ovarian failure: Secondary | ICD-10-CM | POA: Diagnosis not present

## 2018-07-04 DIAGNOSIS — R0789 Other chest pain: Secondary | ICD-10-CM | POA: Diagnosis not present

## 2018-07-04 DIAGNOSIS — E8881 Metabolic syndrome: Secondary | ICD-10-CM | POA: Diagnosis not present

## 2018-07-04 DIAGNOSIS — R5383 Other fatigue: Secondary | ICD-10-CM | POA: Diagnosis not present

## 2018-08-20 DIAGNOSIS — R110 Nausea: Secondary | ICD-10-CM | POA: Diagnosis not present

## 2018-08-20 DIAGNOSIS — M545 Low back pain: Secondary | ICD-10-CM | POA: Diagnosis not present

## 2018-08-20 DIAGNOSIS — R221 Localized swelling, mass and lump, neck: Secondary | ICD-10-CM | POA: Diagnosis not present

## 2018-08-20 DIAGNOSIS — N644 Mastodynia: Secondary | ICD-10-CM | POA: Diagnosis not present

## 2018-08-21 ENCOUNTER — Other Ambulatory Visit (HOSPITAL_COMMUNITY): Payer: Self-pay | Admitting: Internal Medicine

## 2018-08-21 DIAGNOSIS — R221 Localized swelling, mass and lump, neck: Secondary | ICD-10-CM

## 2018-08-22 ENCOUNTER — Other Ambulatory Visit (HOSPITAL_COMMUNITY): Payer: Self-pay | Admitting: Internal Medicine

## 2018-08-22 DIAGNOSIS — Z1231 Encounter for screening mammogram for malignant neoplasm of breast: Secondary | ICD-10-CM

## 2018-08-26 ENCOUNTER — Other Ambulatory Visit (HOSPITAL_COMMUNITY): Payer: Self-pay | Admitting: Internal Medicine

## 2018-08-26 ENCOUNTER — Ambulatory Visit (HOSPITAL_COMMUNITY)
Admission: RE | Admit: 2018-08-26 | Discharge: 2018-08-26 | Disposition: A | Discharge status: Home / Self Care | Payer: BLUE CROSS/BLUE SHIELD | Source: Ambulatory Visit | Attending: Internal Medicine | Admitting: Internal Medicine

## 2018-08-26 DIAGNOSIS — M47817 Spondylosis without myelopathy or radiculopathy, lumbosacral region: Secondary | ICD-10-CM | POA: Insufficient documentation

## 2018-08-26 DIAGNOSIS — M25552 Pain in left hip: Secondary | ICD-10-CM | POA: Diagnosis not present

## 2018-08-26 DIAGNOSIS — M545 Low back pain: Secondary | ICD-10-CM | POA: Insufficient documentation

## 2018-08-26 DIAGNOSIS — R221 Localized swelling, mass and lump, neck: Secondary | ICD-10-CM | POA: Insufficient documentation

## 2018-08-26 DIAGNOSIS — M5126 Other intervertebral disc displacement, lumbar region: Secondary | ICD-10-CM | POA: Diagnosis not present

## 2018-08-26 DIAGNOSIS — M5440 Lumbago with sciatica, unspecified side: Secondary | ICD-10-CM

## 2018-08-26 DIAGNOSIS — M25551 Pain in right hip: Secondary | ICD-10-CM | POA: Diagnosis not present

## 2018-08-26 DIAGNOSIS — M48061 Spinal stenosis, lumbar region without neurogenic claudication: Secondary | ICD-10-CM | POA: Insufficient documentation

## 2018-09-05 DIAGNOSIS — R221 Localized swelling, mass and lump, neck: Secondary | ICD-10-CM | POA: Diagnosis not present

## 2018-09-05 DIAGNOSIS — Z780 Asymptomatic menopausal state: Secondary | ICD-10-CM | POA: Diagnosis not present

## 2018-09-05 DIAGNOSIS — R110 Nausea: Secondary | ICD-10-CM | POA: Diagnosis not present

## 2018-09-05 DIAGNOSIS — R5383 Other fatigue: Secondary | ICD-10-CM | POA: Diagnosis not present

## 2018-09-17 ENCOUNTER — Ambulatory Visit (INDEPENDENT_AMBULATORY_CARE_PROVIDER_SITE_OTHER): Payer: BLUE CROSS/BLUE SHIELD | Admitting: Internal Medicine

## 2018-09-17 ENCOUNTER — Encounter (INDEPENDENT_AMBULATORY_CARE_PROVIDER_SITE_OTHER): Payer: Self-pay | Admitting: Internal Medicine

## 2018-09-17 ENCOUNTER — Encounter (INDEPENDENT_AMBULATORY_CARE_PROVIDER_SITE_OTHER): Payer: Self-pay | Admitting: *Deleted

## 2018-09-17 ENCOUNTER — Telehealth (INDEPENDENT_AMBULATORY_CARE_PROVIDER_SITE_OTHER): Payer: Self-pay | Admitting: *Deleted

## 2018-09-17 VITALS — BP 120/72 | HR 72 | Temp 98.2°F | Ht 69.0 in | Wt 188.2 lb

## 2018-09-17 DIAGNOSIS — R1310 Dysphagia, unspecified: Secondary | ICD-10-CM

## 2018-09-17 DIAGNOSIS — R1319 Other dysphagia: Secondary | ICD-10-CM

## 2018-09-17 DIAGNOSIS — R110 Nausea: Secondary | ICD-10-CM | POA: Diagnosis not present

## 2018-09-17 DIAGNOSIS — K625 Hemorrhage of anus and rectum: Secondary | ICD-10-CM | POA: Diagnosis not present

## 2018-09-17 MED ORDER — SUPREP BOWEL PREP KIT 17.5-3.13-1.6 GM/177ML PO SOLN: 1.0000 | Freq: Once | ORAL | 0 refills | Status: AC

## 2018-09-17 NOTE — Progress Notes (Signed)
Subjective:    Patient ID: Becky Gutierrez, female    DOB: November 05, 1961, 57 y.o.   MRN: [id number]  HPI Referred by Dr. Anastasio Champion for blood in stool/dysphagia and she has chronic nausea. She has lost 10 pounds since 2nd week of September. Has had nausea since the summer. She says foods are lodging in her throat. She has to cough the bolus up or drink water.  She denies GERD. She had a colonoscopy in her 45s and had a polyp. She says she has rectal bleeding. She says she has internal and external hemorrhoids. She has pain LLQ at times. She says the area is tender all the time and sometimes it is stabbing.   Has never undergone colonoscopy.  Her appetite is poor. She is having a BM daily (diarrhea 3-4 daily which has been occurring for months). Stools in the were firm.   She says she sees rectal bleeding daily.  She says sometimes on toilet paper and sometimes in commode.    Personal hx of breast cancer.     RN Review of Systems Past Medical History:  Diagnosis Date  . Bipolar 1 disorder ([place])   . Cancer ([place])    breast  . COPD (chronic obstructive pulmonary disease) ([place])     Past Surgical History:  Procedure Laterality Date  . ABDOMINAL HYSTERECTOMY    . APPENDECTOMY    . BREAST LUMPECTOMY     left side  . CESAREAN SECTION    . CHOLECYSTECTOMY    . HEMORROIDECTOMY      Allergies  Allergen Reactions  . Beef-Derived Products   . Codeine Other (See Comments)    Made patient sleep alot  . Morphine Nausea And Vomiting  . Promethazine Hcl Other (See Comments)    Hallucinations  . Alprazolam Anxiety and Other (See Comments)    Caused had tremors    Current Outpatient Medications on File Prior to Visit  Medication Sig Dispense Refill  . cholecalciferol (VITAMIN D) 1000 UNITS tablet Take 5,000 Units by mouth daily.     Marland Kitchen estrogens, conjugated, (PREMARIN) 0.9 MG tablet Take 0.9 mg by mouth daily. Take daily for 21 days then do not take for 7 days.    Marland Kitchen ibuprofen (ADVIL,MOTRIN)  200 MG tablet Take 200 mg by mouth as needed. For pain     . LORazepam (ATIVAN) 1 MG tablet Take 1 mg by mouth as needed for anxiety.    . Multiple Vitamins-Minerals (CENTRUM SILVER ADULT 50+ PO) Take 1 tablet by mouth daily.    Marland Kitchen OVER THE COUNTER MEDICATION Take 2 capsules by mouth 2 (two) times daily. AMBEREN-MENOPAUSAL SUPPLEMENT    . progesterone (PROMETRIUM) 200 MG capsule Take 200 mg by mouth daily.    . Testosterone 20 % CREA 5 % by Does not apply route.    . thyroid (ARMOUR) 30 MG tablet Take 30 mg by mouth daily before breakfast.     No current facility-administered medications on file prior to visit.         Objective:   Physical Exam Blood pressure 120/72, pulse 72, temperature 98.2 F (36.8 C), height 5\' 9"  (1.753 m), weight 188 lb 3.2 oz (85.4 kg). Alert and oriented. Skin warm and dry. Oral mucosa is moist.   . Sclera anicteric, conjunctivae is pink. Thyroid not enlarged. No cervical lymphadenopathy. Lungs clear. Heart regular rate and rhythm.  Abdomen is soft. Bowel sounds are positive. No hepatomegaly. No abdominal masses felt.  Epigastric tenderness.  No edema  to lower extremities. Rectal exam: no masses felt. Guaiac negative.          Assessment & Plan:  Rectal bleeding. Needs a colonoscopy to rule out colonic neoplasm. Nausea and dysphagia: GERD needs to be ruled out.  Esophageal stricture/web needs to be ruled out.  EGD/ED.

## 2018-09-17 NOTE — Telephone Encounter (Signed)
Patient needs suprep 

## 2018-09-17 NOTE — Patient Instructions (Addendum)
DG. Esophagram.  Will need a colonoscopy.  The risks of bleeding, perforation and infection were reviewed with patient.

## 2018-10-01 ENCOUNTER — Encounter: Payer: Self-pay | Admitting: General Surgery

## 2018-10-01 ENCOUNTER — Ambulatory Visit (INDEPENDENT_AMBULATORY_CARE_PROVIDER_SITE_OTHER): Payer: BLUE CROSS/BLUE SHIELD | Admitting: General Surgery

## 2018-10-01 VITALS — BP 145/73 | HR 81 | Temp 97.7°F | Resp 18 | Wt 193.4 lb

## 2018-10-01 DIAGNOSIS — R599 Enlarged lymph nodes, unspecified: Secondary | ICD-10-CM

## 2018-10-01 NOTE — Progress Notes (Signed)
Becky Gutierrez; [id number]; 05/08/1961   HPI Patient is a 57 year old white female who was referred to my care by Dr. Anastasio Champion for evaluation treatment of lymph node swelling.  Patient states that she has had generalized malaise for over 1 year.  She has had a swelling in the left side of her neck for many months.  She also has developed a swelling which is painful to touch in the right antecubital fossa.  Patient denies any weight loss, fever, or chills.  She does have a history of a left breast cancer that was treated in the remote past.  She states that a breast examination done last month by another primary care physician was unremarkable.  She has not had a mammogram in approximately 6 years.  She is concerned over the swelling of the lymph nodes and is requesting further work-up.  She did have an ultrasound of the neck which revealed benign cervical lymph nodes in the left neck.  No other axillary or inguinal lymphadenopathy is noted.  She currently has 3 out of 10 pain in the right arm with pronation.  Patient also complains of fullness in the back of her throat.  She is seeing an ENT doctor next week for this. Past Medical History:  Diagnosis Date  . Bipolar 1 disorder ([place])   . Cancer ([place])    breast  . COPD (chronic obstructive pulmonary disease) ([place])     Past Surgical History:  Procedure Laterality Date  . ABDOMINAL HYSTERECTOMY    . APPENDECTOMY    . BREAST LUMPECTOMY     left side  . CESAREAN SECTION    . CHOLECYSTECTOMY    . HEMORROIDECTOMY      Family History  Problem Relation Age of Onset  . Diabetes Father   . Heart disease Unknown     Current Outpatient Medications on File Prior to Visit  Medication Sig Dispense Refill  . cholecalciferol (VITAMIN D) 1000 UNITS tablet Take 5,000 Units by mouth daily.     Marland Kitchen estrogens, conjugated, (PREMARIN) 0.9 MG tablet Take 0.9 mg by mouth daily. Take daily for 21 days then do not take for 7 days.    Marland Kitchen ibuprofen (ADVIL,MOTRIN) 200 MG  tablet Take 200 mg by mouth as needed. For pain     . LORazepam (ATIVAN) 1 MG tablet Take 1 mg by mouth as needed for anxiety.    . Multiple Vitamins-Minerals (CENTRUM SILVER ADULT 50+ PO) Take 1 tablet by mouth daily.    Marland Kitchen OVER THE COUNTER MEDICATION Take 2 capsules by mouth 2 (two) times daily. AMBEREN-MENOPAUSAL SUPPLEMENT    . progesterone (PROMETRIUM) 200 MG capsule Take 200 mg by mouth daily.    . Testosterone 20 % CREA 5 % by Does not apply route.    . thyroid (ARMOUR) 30 MG tablet Take 30 mg by mouth daily before breakfast.     No current facility-administered medications on file prior to visit.     Allergies  Allergen Reactions  . Beef-Derived Products   . Codeine Other (See Comments)    Made patient sleep alot  . Morphine Nausea And Vomiting  . Promethazine Hcl Other (See Comments)    Hallucinations  . Alprazolam Anxiety and Other (See Comments)    Caused had tremors    Social History   Substance and Sexual Activity  Alcohol Use No    Social History   Tobacco Use  Smoking Status Current Every Day Smoker  . Packs/day: 0.75  . Years:  30.00  . Pack years: 22.50  . Types: Cigarettes  . Last attempt to quit: 12/19/2011  . Years since quitting: 6.7  Smokeless Tobacco Never Used    Review of Systems  Constitutional: Positive for malaise/fatigue.  HENT: Positive for sinus pain.   Eyes: Positive for pain.  Respiratory: Negative.   Cardiovascular: Negative.   Gastrointestinal: Positive for abdominal pain and heartburn.  Genitourinary: Negative.   Musculoskeletal: Positive for back pain, joint pain and neck pain.  Skin: Negative.   Neurological: Negative.   Endo/Heme/Allergies: Negative.   Psychiatric/Behavioral: Negative.     Objective   Vitals:   10/01/18 1529  BP: (!) 145/73  Pulse: 81  Resp: 18  Temp: 97.7 F (36.5 C)    Physical Exam  Constitutional: She is oriented to person, place, and time. She appears well-developed and well-nourished. No  distress.  HENT:  Head: Normocephalic and atraumatic.  Neck: Normal range of motion. Neck supple. No tracheal deviation present. No thyromegaly present.  Swelling is noted in the left neck, but I could not palpate a discrete lymph node.  Cardiovascular: Normal rate, regular rhythm and normal heart sounds. Exam reveals no gallop and no friction rub.  No murmur heard. Pulmonary/Chest: Effort normal and breath sounds normal. No stridor. No respiratory distress. She has no wheezes. She has no rales.  Abdominal: Soft. Bowel sounds are normal. She exhibits no distension. There is no tenderness. There is no guarding.  Lymphadenopathy:    She has cervical adenopathy.  Neurological: She is alert and oriented to person, place, and time.  Skin: Skin is warm and dry.  Vitals reviewed. Lymph nodes: No axillary or inguinal lymphadenopathy noted. Ultrasound report reviewed Assessment  Shotty lymphadenopathy of unknown etiology Swallowing difficulties, history of left breast cancer Plan   I did offer ultrasound-guided needle aspiration of the lymph node in the left neck, but patient does not want this.  She would like CT scan of the abdomen to rule out lymphadenopathy.  I told her that I would like to see what ENT says prior to any further diagnostic work-up.  He may order CT scan of the neck given any findings on his physical examination.  She will contact me with his treatment plan.  Further management is pending.  I did encourage the patient to undergo mammography given her history of left breast cancer.

## 2018-10-07 ENCOUNTER — Ambulatory Visit (INDEPENDENT_AMBULATORY_CARE_PROVIDER_SITE_OTHER): Payer: BLUE CROSS/BLUE SHIELD | Admitting: Otolaryngology

## 2018-10-07 ENCOUNTER — Other Ambulatory Visit (INDEPENDENT_AMBULATORY_CARE_PROVIDER_SITE_OTHER): Payer: Self-pay | Admitting: Otolaryngology

## 2018-10-07 DIAGNOSIS — R1312 Dysphagia, oropharyngeal phase: Secondary | ICD-10-CM | POA: Diagnosis not present

## 2018-10-07 DIAGNOSIS — R490 Dysphonia: Secondary | ICD-10-CM

## 2018-10-21 ENCOUNTER — Encounter (HOSPITAL_COMMUNITY)
Admission: RE | Disposition: A | Discharge status: Home / Self Care | Payer: Self-pay | Source: Ambulatory Visit | Attending: Internal Medicine

## 2018-10-21 ENCOUNTER — Other Ambulatory Visit (INDEPENDENT_AMBULATORY_CARE_PROVIDER_SITE_OTHER): Payer: Self-pay | Admitting: *Deleted

## 2018-10-21 ENCOUNTER — Ambulatory Visit (HOSPITAL_COMMUNITY)
Admission: RE | Admit: 2018-10-21 | Discharge: 2018-10-21 | Disposition: A | Discharge status: Home / Self Care | Payer: BLUE CROSS/BLUE SHIELD | Source: Ambulatory Visit | Attending: Internal Medicine | Admitting: Internal Medicine

## 2018-10-21 ENCOUNTER — Other Ambulatory Visit: Payer: Self-pay

## 2018-10-21 ENCOUNTER — Encounter (HOSPITAL_COMMUNITY): Payer: Self-pay | Admitting: *Deleted

## 2018-10-21 DIAGNOSIS — Z79899 Other long term (current) drug therapy: Secondary | ICD-10-CM | POA: Insufficient documentation

## 2018-10-21 DIAGNOSIS — D125 Benign neoplasm of sigmoid colon: Secondary | ICD-10-CM | POA: Diagnosis not present

## 2018-10-21 DIAGNOSIS — Z833 Family history of diabetes mellitus: Secondary | ICD-10-CM | POA: Insufficient documentation

## 2018-10-21 DIAGNOSIS — K589 Irritable bowel syndrome without diarrhea: Secondary | ICD-10-CM | POA: Insufficient documentation

## 2018-10-21 DIAGNOSIS — F17210 Nicotine dependence, cigarettes, uncomplicated: Secondary | ICD-10-CM | POA: Insufficient documentation

## 2018-10-21 DIAGNOSIS — R1314 Dysphagia, pharyngoesophageal phase: Secondary | ICD-10-CM | POA: Diagnosis not present

## 2018-10-21 DIAGNOSIS — K2900 Acute gastritis without bleeding: Secondary | ICD-10-CM

## 2018-10-21 DIAGNOSIS — Z853 Personal history of malignant neoplasm of breast: Secondary | ICD-10-CM | POA: Diagnosis not present

## 2018-10-21 DIAGNOSIS — R1319 Other dysphagia: Secondary | ICD-10-CM | POA: Insufficient documentation

## 2018-10-21 DIAGNOSIS — K644 Residual hemorrhoidal skin tags: Secondary | ICD-10-CM | POA: Insufficient documentation

## 2018-10-21 DIAGNOSIS — R1310 Dysphagia, unspecified: Secondary | ICD-10-CM | POA: Insufficient documentation

## 2018-10-21 DIAGNOSIS — Z888 Allergy status to other drugs, medicaments and biological substances status: Secondary | ICD-10-CM | POA: Insufficient documentation

## 2018-10-21 DIAGNOSIS — D122 Benign neoplasm of ascending colon: Secondary | ICD-10-CM | POA: Insufficient documentation

## 2018-10-21 DIAGNOSIS — K625 Hemorrhage of anus and rectum: Secondary | ICD-10-CM | POA: Diagnosis not present

## 2018-10-21 DIAGNOSIS — K648 Other hemorrhoids: Secondary | ICD-10-CM | POA: Insufficient documentation

## 2018-10-21 DIAGNOSIS — J449 Chronic obstructive pulmonary disease, unspecified: Secondary | ICD-10-CM | POA: Diagnosis not present

## 2018-10-21 DIAGNOSIS — K635 Polyp of colon: Secondary | ICD-10-CM | POA: Diagnosis not present

## 2018-10-21 DIAGNOSIS — K921 Melena: Secondary | ICD-10-CM | POA: Insufficient documentation

## 2018-10-21 DIAGNOSIS — Z90710 Acquired absence of both cervix and uterus: Secondary | ICD-10-CM | POA: Diagnosis not present

## 2018-10-21 DIAGNOSIS — Z91018 Allergy to other foods: Secondary | ICD-10-CM | POA: Diagnosis not present

## 2018-10-21 DIAGNOSIS — R110 Nausea: Secondary | ICD-10-CM | POA: Insufficient documentation

## 2018-10-21 DIAGNOSIS — K2970 Gastritis, unspecified, without bleeding: Secondary | ICD-10-CM | POA: Diagnosis not present

## 2018-10-21 DIAGNOSIS — Z8249 Family history of ischemic heart disease and other diseases of the circulatory system: Secondary | ICD-10-CM | POA: Insufficient documentation

## 2018-10-21 DIAGNOSIS — F319 Bipolar disorder, unspecified: Secondary | ICD-10-CM | POA: Insufficient documentation

## 2018-10-21 DIAGNOSIS — K449 Diaphragmatic hernia without obstruction or gangrene: Secondary | ICD-10-CM | POA: Insufficient documentation

## 2018-10-21 DIAGNOSIS — Z9049 Acquired absence of other specified parts of digestive tract: Secondary | ICD-10-CM | POA: Insufficient documentation

## 2018-10-21 DIAGNOSIS — Z885 Allergy status to narcotic agent status: Secondary | ICD-10-CM | POA: Insufficient documentation

## 2018-10-21 HISTORY — PX: COLONOSCOPY: SHX5424

## 2018-10-21 HISTORY — PX: POLYPECTOMY: SHX5525

## 2018-10-21 HISTORY — PX: ESOPHAGOGASTRODUODENOSCOPY: SHX5428

## 2018-10-21 SURGERY — COLONOSCOPY
Anesthesia: Moderate Sedation

## 2018-10-21 MED ORDER — GLUCAGON HCL (RDNA) 1 MG IJ SOLR
INTRAMUSCULAR | Status: DC | PRN
  Administered 2018-10-21 (×2): .5 mg via INTRAVENOUS

## 2018-10-21 MED ORDER — LIDOCAINE VISCOUS HCL 2 % MT SOLN
OROMUCOSAL | Status: DC | PRN
  Administered 2018-10-21: 1 via OROMUCOSAL

## 2018-10-21 MED ORDER — MIDAZOLAM HCL 5 MG/5ML IJ SOLN
INTRAMUSCULAR | Status: AC
  Filled 2018-10-21: qty 5

## 2018-10-21 MED ORDER — HYDROCORTISONE ACETATE 25 MG RE SUPP: 25.0000 mg | Freq: Every day | RECTAL | 1 refills | Status: DC

## 2018-10-21 MED ORDER — LIDOCAINE VISCOUS HCL 2 % MT SOLN
OROMUCOSAL | Status: AC
  Filled 2018-10-21: qty 15

## 2018-10-21 MED ORDER — GLUCAGON HCL RDNA (DIAGNOSTIC) 1 MG IJ SOLR
INTRAMUSCULAR | Status: AC
  Filled 2018-10-21: qty 1

## 2018-10-21 MED ORDER — MIDAZOLAM HCL 5 MG/5ML IJ SOLN
INTRAMUSCULAR | Status: DC | PRN
  Administered 2018-10-21 (×5): 2 mg via INTRAVENOUS
  Administered 2018-10-21 (×2): 1 mg via INTRAVENOUS

## 2018-10-21 MED ORDER — LIDOCAINE HCL URETHRAL/MUCOSAL 2 % EX GEL
CUTANEOUS | Status: DC | PRN
  Administered 2018-10-21: 1

## 2018-10-21 MED ORDER — SODIUM CHLORIDE 0.9 % IV SOLN
INTRAVENOUS | Status: DC
  Administered 2018-10-21: 07:00:00 via INTRAVENOUS

## 2018-10-21 MED ORDER — MEPERIDINE HCL 50 MG/ML IJ SOLN
INTRAMUSCULAR | Status: DC | PRN
  Administered 2018-10-21: 25 mg

## 2018-10-21 MED ORDER — STERILE WATER FOR IRRIGATION IR SOLN
Status: DC | PRN
  Administered 2018-10-21: 1.5 mL

## 2018-10-21 MED ORDER — DICYCLOMINE HCL 10 MG PO CAPS: 10.0000 mg | ORAL_CAPSULE | Freq: Three times a day (TID) | ORAL | 1 refills | Status: DC

## 2018-10-21 MED ORDER — LIDOCAINE HCL URETHRAL/MUCOSAL 2 % EX GEL
CUTANEOUS | Status: AC
  Filled 2018-10-21: qty 30

## 2018-10-21 MED ORDER — MEPERIDINE HCL 50 MG/ML IJ SOLN
INTRAMUSCULAR | Status: AC
  Filled 2018-10-21: qty 1

## 2018-10-21 MED ORDER — MIDAZOLAM HCL 5 MG/5ML IJ SOLN
INTRAMUSCULAR | Status: AC
  Filled 2018-10-21: qty 10

## 2018-10-21 NOTE — Op Note (Signed)
[facility] Patient Name: Becky Gutierrez Procedure Date: 10/21/2018 7:29 AM MRN: [id number] Date of Birth: December 13, 1960 Attending MD: Hildred Laser , MD CSN: [id number] Age: 57 Admit Type: Outpatient Procedure:                Upper GI endoscopy Indications:              Esophageal dysphagia Providers:                Hildred Laser, MD, Lurline Del, RN, Nelma Rothman,                            Technician Referring MD:             Doree Albee, MD Medicines:                Lidocaine jelly, Meperidine 25 mg IV, Midazolam 10                            mg IV Complications:            No immediate complications. Estimated Blood Loss:     Estimated blood loss: none. Estimated blood loss:                            none. Procedure:                Pre-Anesthesia Assessment:                           - Prior to the procedure, a History and Physical                            was performed, and patient medications and                            allergies were reviewed. The patient's tolerance of                            previous anesthesia was also reviewed. The risks                            and benefits of the procedure and the sedation                            options and risks were discussed with the patient.                            All questions were answered, and informed consent                            was obtained. Prior Anticoagulants: The patient                            last took ibuprofen 4 days prior to the procedure.  ASA Grade Assessment: II - A patient with mild                            systemic disease. After reviewing the risks and                            benefits, the patient was deemed in satisfactory                            condition to undergo the procedure.                           After obtaining informed consent, the endoscope was                            passed under direct vision. Throughout the          procedure, the patient's blood pressure, pulse, and                            oxygen saturations were monitored continuously. The                            GIF-H190 ([id number]) scope was introduced through the                            mouth, and advanced to the second part of duodenum.                            The upper GI endoscopy was accomplished without                            difficulty. The patient tolerated the procedure                            well. Scope In: 7:52:21 AM Scope Out: 8:01:20 AM Total Procedure Duration: 0 hours 8 minutes 59 seconds  Findings:      The examined esophagus was normal.      The Z-line was regular and was found 39 cm from the incisors.      A 2 cm hiatal hernia was present.      No endoscopic abnormality was evident in the esophagus to explain the       patient's complaint of dysphagia. It was decided, however, to proceed       with dilation of the entire esophagus. The scope was withdrawn. Dilation       was attempted but not performed because patient was not well sedated.      Scattered mild inflammation characterized by congestion (edema) and       erythema petechiael hemorrhages was found in the gastric body and in the       gastric antrum.      The exam of the stomach was otherwise normal.      The duodenal bulb and second portion of the duodenum were normal. Impression:               - Normal esophagus.                           -  Z-line regular, 39 cm from the incisors.                           - 2 cm hiatal hernia.                           - No endoscopic esophageal abnormality to explain                            patient's dysphagia. Unable to dilate because                            patient not well sedated.                           - Gastritis.                           - No specimens collected. Moderate Sedation:      Moderate (conscious) sedation was administered by the endoscopy nurse       and supervised by the  endoscopist. The following parameters were       monitored: oxygen saturation, heart rate, blood pressure, CO2       capnography and response to care. Total physician intraservice time was       19 minutes. Recommendation:           - Patient has a contact number available for                            emergencies. The signs and symptoms of potential                            delayed complications were discussed with the                            patient. Return to normal activities tomorrow.                            Written discharge instructions were provided to the                            patient.                           - Resume previous diet today.                           - Continue present medications.                           - Keep NSAID use to minimum.                           - Perform an H. pylori serology today.                           - Barium pill esophagogram  to be scheduled. Procedure Code(s):        --- Professional ---                           [phone], Esophagogastroduodenoscopy, flexible,                            transoral; diagnostic, including collection of                            specimen(s) by brushing or washing, when performed                            (separate procedure)                           43450, Dilation of esophagus, by unguided sound or                            bougie, single or multiple passes                           G0500, Moderate sedation services provided by the                            same physician or other qualified health care                            professional performing a gastrointestinal                            endoscopic service that sedation supports,                            requiring the presence of an independent trained                            observer to assist in the monitoring of the                            patient's level of consciousness and physiological                             status; initial 15 minutes of intra-service time;                            patient age 2 years or older (additional time may                            be reported with [phone], as appropriate) Diagnosis Code(s):        --- Professional ---                           K44.9, Diaphragmatic hernia without obstruction or  gangrene                           K29.70, Gastritis, unspecified, without bleeding                           R13.14, Dysphagia, pharyngoesophageal phase CPT copyright 2018 [employer]. All rights reserved. The codes documented in this report are preliminary and upon coder review may  be revised to meet current compliance requirements. Hildred Laser, MD Hildred Laser, MD 10/21/2018 8:49:42 AM This report has been signed electronically. Number of Addenda: 0

## 2018-10-21 NOTE — Op Note (Signed)
[facility] Patient Name: Becky Gutierrez Procedure Date: 10/21/2018 8:03 AM MRN: [id number] Date of Birth: 06/29/1961 Attending MD: Hildred Laser , MD CSN: [id number] Age: 57 Admit Type: Outpatient Procedure:                Colonoscopy Indications:              Rectal bleeding Providers:                Hildred Laser, MD, Lurline Del, RN, Nelma Rothman,                            Technician Referring MD:             Doree Albee, MD Medicines:                Midazolam 2 mg IV Complications:            No immediate complications. Estimated Blood Loss:     Estimated blood loss: minimal. Procedure:                Pre-Anesthesia Assessment:                           - Prior to the procedure, a History and Physical                            was performed, and patient medications and                            allergies were reviewed. The patient's tolerance of                            previous anesthesia was also reviewed. The risks                            and benefits of the procedure and the sedation                            options and risks were discussed with the patient.                            All questions were answered, and informed consent                            was obtained. Prior Anticoagulants: The patient                            last took ibuprofen 4 days prior to the procedure.                            ASA Grade Assessment: II - A patient with mild                            systemic disease. After reviewing the risks and  benefits, the patient was deemed in satisfactory                            condition to undergo the procedure.                           After obtaining informed consent, the colonoscope                            was passed under direct vision. Throughout the                            procedure, the patient's blood pressure, pulse, and                            oxygen saturations were monitored  continuously. The                            [id number]-H190DL ([id number]) scope was introduced through                            the anus and advanced to the the terminal ileum,                            with identification of the appendiceal orifice and                            IC valve. The colonoscopy was performed without                            difficulty. The patient tolerated the procedure                            well. The quality of the bowel preparation was                            adequate. The terminal ileum, ileocecal valve,                            appendiceal orifice, and rectum were photographed. Scope In: 8:06:31 AM Scope Out: 8:31:29 AM Scope Withdrawal Time: 0 hours 19 minutes 40 seconds  Total Procedure Duration: 0 hours 24 minutes 58 seconds  Findings:      Skin tags were found on perianal exam.      The digital rectal exam was normal.      The terminal ileum appeared normal.      A 7 mm polyp was found in the ascending colon. The polyp was sessile.       The polyp was removed with a hot snare. Resection and retrieval were       complete. The pathology specimen was placed into Bottle Number 1. To       close a defect after polypectomy, one hemostatic clip was successfully       placed (MR conditional). There was no bleeding during, or at the end, of  the procedure.      A 7 mm polyp was found in the mid sigmoid colon. The polyp was sessile.       The polyp was removed with a hot snare. Resection and retrieval were       complete. The pathology specimen was placed into Bottle Number 2.      External and internal hemorrhoids were found during retroflexion. The       hemorrhoids were medium-sized. Impression:               - Perianal skin tags found on perianal exam.                           - The examined portion of the ileum was normal.                           - One 7 mm polyp in the ascending colon, removed                            with a hot snare.  Resected and retrieved. Clip (MR                            conditional) was placed.                           - One 7 mm polyp in the mid sigmoid colon, removed                            with a hot snare. Resected and retrieved.                           - External and internal hemorrhoids. Moderate Sedation:      Moderate (conscious) sedation was administered by the endoscopy nurse       and supervised by the endoscopist. The following parameters were       monitored: oxygen saturation, heart rate, blood pressure, CO2       capnography and response to care. Total physician intraservice time was       30 minutes. Recommendation:           - Patient has a contact number available for                            emergencies. The signs and symptoms of potential                            delayed complications were discussed with the                            patient. Return to normal activities tomorrow.                            Written discharge instructions were provided to the                            patient.                           -  Resume previous diet today.                           - Continue present medications.                           - No aspirin, ibuprofen, naproxen, or other                            non-steroidal anti-inflammatory drugs for 7 days                            after polyp removal.                           - Use Bentyl (dicyclomine) 10 mg PO TID 30 min AC                            today.                           - Use hydrocortisone suppository 25 mg 1 per rectum                            once a day for 2 weeks.                           - Await pathology results.                           - Repeat colonoscopy for surveillance based on                            pathology results. Procedure Code(s):        --- Professional ---                           [phone], Colonoscopy, flexible; with removal of                            tumor(s), polyp(s),  or other lesion(s) by snare                            technique                           99153, Moderate sedation; each additional 15                            minutes intraservice time                           G0500, Moderate sedation services provided by the                            same physician or other qualified health care  professional performing a gastrointestinal                            endoscopic service that sedation supports,                            requiring the presence of an independent trained                            observer to assist in the monitoring of the                            patient's level of consciousness and physiological                            status; initial 15 minutes of intra-service time;                            patient age 93 years or older (additional time may                            be reported with [phone], as appropriate) Diagnosis Code(s):        --- Professional ---                           K64.8, Other hemorrhoids                           D12.2, Benign neoplasm of ascending colon                           D12.5, Benign neoplasm of sigmoid colon                           K64.4, Residual hemorrhoidal skin tags                           K62.5, Hemorrhage of anus and rectum CPT copyright 2018 American [employer]. All rights reserved. The codes documented in this report are preliminary and upon coder review may  be revised to meet current compliance requirements. Hildred Laser, MD Hildred Laser, MD 10/21/2018 8:56:09 AM This report has been signed electronically. Number of Addenda: 0

## 2018-10-21 NOTE — H&P (Addendum)
Becky Gutierrez is an 57 y.o. female.   Chief Complaint: Patient is here for EGD ED and colonoscopy. HPI: Patient is a 57 year old Caucasian female who presents with history of solid food dysphagia for 6 to 8 months.  She points to the suprasternal area on the left side.  She denies frequent heartburn.  She has nausea but no vomiting.  At times she has to drink water to push the food down.  She is also undergoing screening colonoscopy.  She has a history of IBS.  She generally has 2-3 bowel movements per day.  Most of her stools are mushy.  She also gives history of rectal bleeding she bleeds 3-4 times every week.  Bleeding occurs with bowel movements.  She is known to have hemorrhoids.  She also complains of LLQ abdominal pain which started about 2 weeks ago.  She had flexible sigmoidoscopy over 20 years ago and had hyperplastic polyp removed. Family history is negative for CRC or IBD.  Past Medical History:  Diagnosis Date  . Bipolar 1 disorder ([place])   . Cancer ([place])    breast  . COPD (chronic obstructive pulmonary disease) ([place])     Past Surgical History:  Procedure Laterality Date  . ABDOMINAL HYSTERECTOMY    . APPENDECTOMY    . BREAST LUMPECTOMY     left side  . CESAREAN SECTION    . CHOLECYSTECTOMY    . HEMORROIDECTOMY      Family History  Problem Relation Age of Onset  . Diabetes Father   . Heart disease Unknown    Social History:  reports that she has been smoking cigarettes. She has a 22.50 pack-year smoking history. She has never used smokeless tobacco. She reports that she does not drink alcohol or use drugs.  Allergies:  Allergies  Allergen Reactions  . Beef-Derived Products   . Codeine Other (See Comments)    Made patient sleep alot  . Morphine Nausea And Vomiting  . Promethazine Hcl Other (See Comments)    Hallucinations  . Alprazolam Anxiety and Other (See Comments)    Caused had tremors    Medications Prior to Admission  Medication Sig Dispense Refill  .  Cholecalciferol (VITAMIN D PO) Take 5,000 Units by mouth daily.     Marland Kitchen estradiol (ESTRACE) 1 MG tablet Take 1 mg by mouth daily.    Marland Kitchen ibuprofen (ADVIL,MOTRIN) 200 MG tablet Take 800 mg by mouth every 6 (six) hours as needed. For pain     . LORazepam (ATIVAN) 1 MG tablet Take 1 mg by mouth daily as needed for anxiety.     . Prenatal MV & Min w/FA-DHA (PRENATAL ADULT GUMMY/DHA/FA PO) Take 2 each by mouth daily.    . progesterone (PROMETRIUM) 200 MG capsule Take 200 mg by mouth at bedtime.     . Testosterone 10 MG/ACT (2%) GEL Place 1 Pump onto the skin daily.    Marland Kitchen thyroid (ARMOUR) 30 MG tablet Take 30 mg by mouth daily before breakfast.      No results found for this or any previous visit (from the past 48 hour(s)). No results found.  ROS  Blood pressure 112/70, pulse 80, resp. rate 14, SpO2 97 %. Physical Exam  Constitutional: She appears well-developed and well-nourished.  HENT:  Mouth/Throat: Oropharynx is clear and moist.  Eyes: Conjunctivae are normal. No scleral icterus.  Neck: No thyromegaly present.  Cardiovascular: Normal rate, regular rhythm and normal heart sounds.  No murmur heard. Respiratory: Effort normal and breath  sounds normal.  GI:  She has lower midline scar.  Abdomen is symmetrical soft with mild generalized tenderness.  No guarding.  No organomegaly or masses.  Musculoskeletal: She exhibits no edema.  Lymphadenopathy:    She has no cervical adenopathy.  Neurological: She is alert.  Skin: Skin is warm and dry.  Tattoo about the left ankle on the medial side.     Assessment/Plan Solid food dysphagia. Hematochezia. EGD with ED and diagnostic colonoscopy.  Hildred Laser, MD 10/21/2018, 7:34 AM

## 2018-10-21 NOTE — Discharge Instructions (Signed)
No aspirin or NSAIDs for 1 week. Resume other medications as before. Dicyclomine 10 mg by mouth 30 minutes before each meal daily. Resume usual diet. No driving for 24 hours. Physician  will call with results of blood test and biopsy. Anusol per rectum  Daily at bedtime for 2 weeks  Colonoscopy, Adult, Care After This sheet gives you information about how to care for yourself after your procedure. Your health care provider may also give you more specific instructions. If you have problems or questions, contact your health care provider.  Dr Laural Golden:  [phone]; after hours and weekends call the hospital and have the GI doctor on call paged.  They will call you back. What can I expect after the procedure? After the procedure, it is common to have:  A small amount of blood in your stool for 24 hours after the procedure.  Some gas.  Mild abdominal cramping or bloating.  Follow these instructions at home: General instructions   For the first 24 hours after the procedure: ? Do not drive or use machinery. ? Do not sign important documents. ? Do not drink alcohol. ? Eat soft, easy-to-digest foods. ? Rest often.  Take over-the-counter or prescription medicines only as told by your health care provider.  It is up to you to get the results of your procedure. Ask your health care provider, or the department performing the procedure, when your results will be ready. Relieving cramping and bloating  Try walking around when you have cramps or feel bloated.  Apply heat to your abdomen as told by your health care provider. Use a heat source that your health care provider recommends, such as a moist heat pack or a heating pad. ? Place a towel between your skin and the heat source. ? Leave the heat on for 20-30 minutes. ? Remove the heat if your skin turns bright red. This is especially important if you are unable to feel pain, heat, or cold. You may have a greater risk of getting  burned. Eating and drinking  Drink enough fluid to keep your urine clear or pale yellow.  Resume your normal diet as instructed by your health care provider.  Contact a health care provider if:  You have blood in your stool 2-3 days after the procedure. Get help right away if:  You have more than a small spotting of blood in your stool.  You pass large blood clots in your stool.  Your abdomen is swollen.  You have nausea or vomiting.  You have a fever.  You have increasing abdominal pain that is not relieved with medicine. This information is not intended to replace advice given to you by your health care provider. Make sure you discuss any questions you have with your health care provider. Document Released: 07/11/2004 Document Revised: 08/21/2016 Document Reviewed: 02/08/2016 Elsevier Interactive Patient Education  2018 [employer].  Esophagogastroduodenoscopy, Care After Refer to this sheet in the next few weeks. These instructions provide you with information about caring for yourself after your procedure. Your health care provider may also give you more specific instructions. Your treatment has been planned according to current medical practices, but problems sometimes occur. Call your health care provider if you have any problems or questions after your procedure. What can I expect after the procedure? After the procedure, it is common to have:  A sore throat.  Nausea.  Bloating.  Dizziness.  Fatigue.  Follow these instructions at home:  Do not eat or drink anything  until the numbing medicine (local anesthetic) has worn off and your gag reflex has returned. You will know that the local anesthetic has worn off when you can swallow comfortably.  Do not drive for 24 hours if you received a medicine to help you relax (sedative).  If your health care provider took a tissue sample for testing during the procedure, make sure to get your test results. This is your  responsibility. Ask your health care provider or the department performing the test when your results will be ready.  Keep all follow-up visits as told by your health care provider. This is important. Contact a health care provider if:  You cannot stop coughing.  You are not urinating.  You are urinating less than usual. Get help right away if:  You have trouble swallowing.  You cannot eat or drink.  You have throat or chest pain that gets worse.  You have nausea or vomiting.  You have a fever.  You have severe abdominal pain.  You have black, tarry, or bloody stools. This information is not intended to replace advice given to you by your health care provider. Make sure you discuss any questions you have with your health care provider. Document Released: 11/13/2012 Document Revised: 05/04/2016 Document Reviewed: 10/21/2015 Elsevier Interactive Patient Education  [employer].

## 2018-10-22 LAB — H. PYLORI ANTIBODY, IGG: H Pylori IgG: 0.24 Index Value (ref 0.00–0.79)

## 2018-10-25 ENCOUNTER — Encounter (HOSPITAL_COMMUNITY): Payer: Self-pay | Admitting: Internal Medicine

## 2018-10-25 ENCOUNTER — Ambulatory Visit (HOSPITAL_COMMUNITY): Payer: BLUE CROSS/BLUE SHIELD

## 2018-11-04 ENCOUNTER — Ambulatory Visit (HOSPITAL_COMMUNITY)
Admission: RE | Admit: 2018-11-04 | Discharge: 2018-11-04 | Disposition: A | Discharge status: Home / Self Care | Payer: BLUE CROSS/BLUE SHIELD | Source: Ambulatory Visit | Attending: Internal Medicine | Admitting: Internal Medicine

## 2018-11-04 DIAGNOSIS — R1310 Dysphagia, unspecified: Secondary | ICD-10-CM | POA: Insufficient documentation

## 2018-11-13 ENCOUNTER — Other Ambulatory Visit (HOSPITAL_COMMUNITY): Payer: Self-pay | Admitting: Nurse Practitioner

## 2018-11-13 DIAGNOSIS — Z780 Asymptomatic menopausal state: Secondary | ICD-10-CM

## 2018-11-16 ENCOUNTER — Other Ambulatory Visit (INDEPENDENT_AMBULATORY_CARE_PROVIDER_SITE_OTHER): Payer: Self-pay | Admitting: Internal Medicine

## 2018-11-20 ENCOUNTER — Ambulatory Visit (INDEPENDENT_AMBULATORY_CARE_PROVIDER_SITE_OTHER): Payer: BLUE CROSS/BLUE SHIELD

## 2018-11-20 ENCOUNTER — Ambulatory Visit (INDEPENDENT_AMBULATORY_CARE_PROVIDER_SITE_OTHER): Payer: BLUE CROSS/BLUE SHIELD | Admitting: Orthopedic Surgery

## 2018-11-20 ENCOUNTER — Encounter

## 2018-11-20 ENCOUNTER — Encounter: Payer: Self-pay | Admitting: Orthopedic Surgery

## 2018-11-20 ENCOUNTER — Ambulatory Visit (HOSPITAL_COMMUNITY)
Admission: RE | Admit: 2018-11-20 | Discharge: 2018-11-20 | Disposition: A | Discharge status: Home / Self Care | Payer: BLUE CROSS/BLUE SHIELD | Source: Ambulatory Visit | Attending: Nurse Practitioner | Admitting: Nurse Practitioner

## 2018-11-20 VITALS — BP 130/83 | HR 75 | Ht 68.5 in | Wt 183.0 lb

## 2018-11-20 DIAGNOSIS — M8588 Other specified disorders of bone density and structure, other site: Secondary | ICD-10-CM | POA: Diagnosis not present

## 2018-11-20 DIAGNOSIS — M810 Age-related osteoporosis without current pathological fracture: Secondary | ICD-10-CM | POA: Diagnosis not present

## 2018-11-20 DIAGNOSIS — M25521 Pain in right elbow: Secondary | ICD-10-CM

## 2018-11-20 DIAGNOSIS — M25531 Pain in right wrist: Secondary | ICD-10-CM

## 2018-11-20 DIAGNOSIS — Z780 Asymptomatic menopausal state: Secondary | ICD-10-CM

## 2018-11-20 DIAGNOSIS — G5631 Lesion of radial nerve, right upper limb: Secondary | ICD-10-CM

## 2018-11-20 MED ORDER — PREDNISONE 10 MG (48) PO TBPK: ORAL_TABLET | Freq: Every day | ORAL | 0 refills | Status: DC

## 2018-11-20 MED ORDER — PREGABALIN 50 MG PO CAPS: 50.0000 mg | ORAL_CAPSULE | Freq: Three times a day (TID) | ORAL | 2 refills | Status: DC

## 2018-11-20 NOTE — Patient Instructions (Addendum)
Smoking Cessation Quitting smoking is important to your health and has many advantages. However, it is not always easy to quit since nicotine is a very addictive drug. Oftentimes, people try 3 times or more before being able to quit. This document explains the best ways for you to prepare to quit smoking. Quitting takes hard work and a lot of effort, but you can do it. ADVANTAGES OF QUITTING SMOKING  You will live longer, feel better, and live better.  Your body will feel the impact of quitting smoking almost immediately.  Within 20 minutes, blood pressure decreases. Your pulse returns to its normal level.  After 8 hours, carbon monoxide levels in the blood return to normal. Your oxygen level increases.  After 24 hours, the chance of having a heart attack starts to decrease. Your breath, hair, and body stop smelling like smoke.  After 48 hours, damaged nerve endings begin to recover. Your sense of taste and smell improve.  After 72 hours, the body is virtually free of nicotine. Your bronchial tubes relax and breathing becomes easier.  After 2 to 12 weeks, lungs can hold more air. Exercise becomes easier and circulation improves.  The risk of having a heart attack, stroke, cancer, or lung disease is greatly reduced.  After 1 year, the risk of coronary heart disease is cut in half.  After 5 years, the risk of stroke falls to the same as a nonsmoker.  After 10 years, the risk of lung cancer is cut in half and the risk of other cancers decreases significantly.  After 15 years, the risk of coronary heart disease drops, usually to the level of a nonsmoker.  If you are pregnant, quitting smoking will improve your chances of having a healthy baby.  The people you live with, especially any children, will be healthier.  You will have extra money to spend on things other than cigarettes. QUESTIONS TO THINK ABOUT BEFORE ATTEMPTING TO QUIT You may want to talk about your answers with your  health care provider.  Why do you want to quit?  If you tried to quit in the past, what helped and what did not?  What will be the most difficult situations for you after you quit? How will you plan to handle them?  Who can help you through the tough times? Your family? Friends? A health care provider?  What pleasures do you get from smoking? What ways can you still get pleasure if you quit? Here are some questions to ask your health care provider:  How can you help me to be successful at quitting?  What medicine do you think would be best for me and how should I take it?  What should I do if I need more help?  What is smoking withdrawal like? How can I get information on withdrawal? GET READY  Set a quit date.  Change your environment by getting rid of all cigarettes, ashtrays, matches, and lighters in your home, car, or work. Do not let people smoke in your home.  Review your past attempts to quit. Think about what worked and what did not. GET SUPPORT AND ENCOURAGEMENT You have a better chance of being successful if you have help. You can get support in many ways.  Tell your family, friends, and coworkers that you are going to quit and need their support. Ask them not to smoke around you.  Get individual, group, or telephone counseling and support. Programs are available at local hospitals and health centers. Call   your local health department for information about programs in your area.  Spiritual beliefs and practices may help some smokers quit.  Download a "quit meter" on your computer to keep track of quit statistics, such as how long you have gone without smoking, cigarettes not smoked, and money saved.  Get a self-help book about quitting smoking and staying off tobacco. Milo yourself from urges to smoke. Talk to someone, go for a walk, or occupy your time with a task.  Change your normal routine. Take a different route to work.  Drink tea instead of coffee. Eat breakfast in a different place.  Reduce your stress. Take a hot bath, exercise, or read a book.  Plan something enjoyable to do every day. Reward yourself for not smoking.  Explore interactive web-based programs that specialize in helping you quit. GET MEDICINE AND USE IT CORRECTLY Medicines can help you stop smoking and decrease the urge to smoke. Combining medicine with the above behavioral methods and support can greatly increase your chances of successfully quitting smoking.  Nicotine replacement therapy helps deliver nicotine to your body without the negative effects and risks of smoking. Nicotine replacement therapy includes nicotine gum, lozenges, inhalers, nasal sprays, and skin patches. Some may be available over-the-counter and others require a prescription.  Antidepressant medicine helps people abstain from smoking, but how this works is unknown. This medicine is available by prescription.  Nicotinic receptor partial agonist medicine simulates the effect of nicotine in your brain. This medicine is available by prescription. Ask your health care provider for advice about which medicines to use and how to use them based on your health history. Your health care provider will tell you what side effects to look out for if you choose to be on a medicine or therapy. Carefully read the information on the package. Do not use any other product containing nicotine while using a nicotine replacement product.  RELAPSE OR DIFFICULT SITUATIONS Most relapses occur within the first 3 months after quitting. Do not be discouraged if you start smoking again. Remember, most people try several times before finally quitting. You may have symptoms of withdrawal because your body is used to nicotine. You may crave cigarettes, be irritable, feel very hungry, cough often, get headaches, or have difficulty concentrating. The withdrawal symptoms are only temporary. They are strongest  when you first quit, but they will go away within 10-14 days. To reduce the chances of relapse, try to:  Avoid drinking alcohol. Drinking lowers your chances of successfully quitting.  Reduce the amount of caffeine you consume. Once you quit smoking, the amount of caffeine in your body increases and can give you symptoms, such as a rapid heartbeat, sweating, and anxiety.  Avoid smokers because they can make you want to smoke.  Do not let weight gain distract you. Many smokers will gain weight when they quit, usually less than 10 pounds. Eat a healthy diet and stay active. You can always lose the weight gained after you quit.  Find ways to improve your mood other than smoking. FOR MORE INFORMATION  www.smokefree.gov  Document Released: 11/21/2001 Document Revised: 04/13/2014 Document Reviewed: 03/07/2012 [facility] Patient Information 2015 [place], [place]. This information is not intended to replace advice given to you by your health care provider. Make sure you discuss any questions you have with your health care provider.  Radial Tunnel Syndrome  Radial tunnel syndrome happens when the nerve that extends from the back of your upper arm to  your forearm (radial nerve) gets squeezed (compressed). The condition is usually caused by inflammation, an injury, or a tumor that puts pressure on the nerve and traps it. Radial tunnel syndrome can cause stabbing pain in the hand and arm. What are the causes? This condition may be caused by:  Repeatedly using your forearm too much, especially to twist or extend your arm.  Muscle inflammation.  An injury.  A mass of nerve tissue (ganglia).  Noncancerous fatty tumors (lipomas).  A bone tumor.  What increases the risk? This condition is more likely to develop in people who:  Play sports that involve moving their forearm a lot, such as tennis.  Have a job that involves using their forearm a lot, such as some factory jobs.  What are the signs or  symptoms? Symptoms of this condition include:  Aching pain on the top of the forearm, back of the hand, or side of the elbow.  Pain while straightening the wrist or fingers.  Weakness in the forearm muscles and wrist.  How is this diagnosed? This condition may be diagnosed based on your symptoms, your medical history, and a physical exam. During the exam, you may be asked to move your hand, fingers, wrist, and arm in certain ways so your health care provider can find the source of your pain. Your health care provider may also order one or more tests to rule out other conditions. The tests may include:  An electromyogram (EMG). This test can show how well the radial nerve is working.  A nerve conduction study. This test measures how well electrical signals pass through your nerves.  An MRI. This test checks for causes of nerve problems, such as scarring from an injury, pressure on a nerve, or a tumor.  An ultrasound. This test can show certain injuries, such as tears to ligaments or tendons.  How is this treated? Treatment for this condition may include:  Avoiding activities that cause your symptoms to get worse or flare up.  Taking steroids or anti-inflammatory medicines, like ibuprofen, to help with symptoms.  Wearing a splint or brace for support until your symptoms go away.  Doing exercises to regain strength and maintain movement and range of motion in your hand and arm.   Usually, surgery is not needed unless other treatments fail and symptoms last longer than 3 months. Follow these instructions at home: If You Have a Splint or Brace:  Wear it as told by your health care provider. Remove it only as told by your health care provider.  Loosen it if your fingers tingle, become numb, or turn cold and blue.  Do not let it get wet if it is not waterproof.  Keep it clean. Activity  Return to your normal activities as told by your health care provider. Ask your health care  provider what activities are safe for you.  Do exercises as told by your health care provider. General Instructions  Do not use any tobacco products, including cigarettes, chewing tobacco, or e-cigarettes. If you need help quitting, ask your health care provider.  Take over-the-counter and prescription medicines only as told by your health care provider.  Keep all follow-up visits as told by your health care provider. This is important. How is this prevented?  Warm up and stretch before being active.  Cool down and stretch after being active.  Give your body time to rest between periods of activity.  Make sure to use equipment that fits you.  Be safe and  responsible while being active to avoid falls.  Do at least 150 minutes of moderate-intensity exercise each week, such as brisk walking or water aerobics.  Maintain physical fitness, including: ? Strength. ? Flexibility. ? Cardiovascular fitness. ? Endurance. Contact a health care provider if:  Your symptoms do not improve within 12 weeks.  Your symptoms get worse.  Your wrist gets weak or droopy. Get help right away if:  Your pain is severe.  You cannot move part of your hand or arm. This information is not intended to replace advice given to you by your health care provider. Make sure you discuss any questions you have with your health care provider. Document Released: 11/27/2005 Document Revised: 08/04/2016 Document Reviewed: 10/23/2015 Elsevier Interactive Patient Education  [employer].

## 2018-11-20 NOTE — Progress Notes (Signed)
NEW PATIENT OFFICE VISIT  Chief Complaint  Patient presents with  . Arm Pain    Right arm originating in elbow    57 year old female comes in with vague discomfort right arm antecubital fossa posterior upper arm radiating down towards the wrist.  Status post open treatment internal fixation right distal radius several years ago via a volar approach  Comes in with pain over the last 5 to 6 months with weakness in the upper extremity painful pronation supination of the forearm.  She suffice severe but vague discomfort in the right upper extremity.  She notices some weakness in the right arm as well.   Review of Systems  Constitutional: Negative for chills and fever.  Musculoskeletal: Negative for joint pain.  Neurological: Negative for tingling.     Past Medical History:  Diagnosis Date  . Bipolar 1 disorder ([place])   . Cancer ([place])    breast  . COPD (chronic obstructive pulmonary disease) ([place])     Past Surgical History:  Procedure Laterality Date  . ABDOMINAL HYSTERECTOMY    . APPENDECTOMY    . BREAST LUMPECTOMY     left side  . CESAREAN SECTION    . CHOLECYSTECTOMY    . COLONOSCOPY N/A 10/21/2018   Procedure: COLONOSCOPY;  Surgeon: Rogene Houston, MD;  Location[facility];  Service: Endoscopy;  Laterality: N/A;  12:00-rescheduled 11/11 @ 7:30am per Lelon Frohlich  . ESOPHAGOGASTRODUODENOSCOPY N/A 10/21/2018   Procedure: ESOPHAGOGASTRODUODENOSCOPY (EGD);  Surgeon: Rogene Houston, MD;  Location: [facility];  Service: Endoscopy;  Laterality: N/A;  . HEMORROIDECTOMY    . POLYPECTOMY  10/21/2018   Procedure: POLYPECTOMY;  Surgeon: Rogene Houston, MD;  Location: [facility];  Service: Endoscopy;;  colon    Family History  Problem Relation Age of Onset  . Diabetes Father   . Heart disease Unknown    Social History   Tobacco Use  . Smoking status: Current Every Day Smoker    Packs/day: 0.75    Years: 30.00    Pack years: 22.50    Types: Cigarettes  . Smokeless  tobacco: Never Used  Substance Use Topics  . Alcohol use: No  . Drug use: No    Allergies  Allergen Reactions  . Beef-Derived Products   . Codeine Other (See Comments)    Made patient sleep alot  . Morphine Nausea And Vomiting  . Promethazine Hcl Other (See Comments)    Hallucinations  . Alprazolam Anxiety and Other (See Comments)    Caused had tremors    Current Meds  Medication Sig  . Cholecalciferol (VITAMIN D PO) Take 5,000 Units by mouth daily.   Marland Kitchen estradiol (ESTRACE) 1 MG tablet Take 1 mg by mouth daily.  . Prenatal MV & Min w/FA-DHA (PRENATAL ADULT GUMMY/DHA/FA PO) Take 2 each by mouth daily.  . progesterone (PROMETRIUM) 200 MG capsule Take 200 mg by mouth at bedtime.   . Testosterone 10 MG/ACT (2%) GEL Place 1 Pump onto the skin daily.    BP 130/83   Pulse 75   Ht 5' 8.5" (1.74 m)   Wt 183 lb (83 kg)   BMI 27.42 kg/m   Physical Exam  Constitutional: She is oriented to person, place, and time. She appears well-developed and well-nourished.  Neurological: She is alert and oriented to person, place, and time.  Psychiatric: She has a normal mood and affect. Judgment normal.  Vitals reviewed.   Ortho Exam  LEFT extremity normal range of motion stability strength  no instability muscle tone normal no tenderness no swelling neurovascular exam is intact skin of the left upper extremity normal  MEDICAL DECISION SECTION  Xrays were done at [facility] shows well-positioned distal radius volar plate anatomic reduction  Elbow x-ray [facility] lateral shows no fracture dislocation arthritis loose bodies or soft tissue swelling  Please see dictated reports of both films    Encounter Diagnoses  Name Primary?  . Right elbow pain   . Right wrist pain   . Radial tunnel syndrome of right upper extremity Yes    PLAN: (Rx., injectx, surgery, frx, mri/ct) NSAIDS GABAPENTINOIDS SPLINT   Meds ordered this encounter  Medications  . pregabalin (LYRICA) 50  MG capsule    Sig: Take 1 capsule (50 mg total) by mouth 3 (three) times daily.    Dispense:  90 capsule    Refill:  2  . predniSONE (STERAPRED UNI-PAK 48 TAB) 10 MG (48) TBPK tablet    Sig: Take by mouth daily.    Dispense:  48 tablet    Refill:  0    Arther Abbott, MD  11/20/2018 3:33 PM

## 2018-12-14 ENCOUNTER — Other Ambulatory Visit (HOSPITAL_COMMUNITY): Payer: Self-pay | Admitting: Internal Medicine

## 2019-01-01 ENCOUNTER — Ambulatory Visit (INDEPENDENT_AMBULATORY_CARE_PROVIDER_SITE_OTHER): Payer: BLUE CROSS/BLUE SHIELD | Admitting: Orthopedic Surgery

## 2019-01-01 VITALS — BP 118/83 | HR 82 | Ht 68.5 in | Wt 183.0 lb

## 2019-01-01 DIAGNOSIS — G5631 Lesion of radial nerve, right upper limb: Secondary | ICD-10-CM

## 2019-01-01 MED ORDER — PREDNISONE 10 MG PO TABS: 10.0000 mg | ORAL_TABLET | Freq: Every day | ORAL | 0 refills | Status: DC

## 2019-01-01 NOTE — Progress Notes (Signed)
Progress Note   Patient ID: Becky Gutierrez, female   DOB: 15-May-1961, 58 y.o.   MRN: [id number]   Chief Complaint  Patient presents with  . Follow-up    Recheck on right radial tunnel syndrome.    Follow-up visit for radial tunnel syndrome.  We gave the patient Lyrica 50 mg 3 times a day she tolerated that well we also put on a prednisone Dosepak which helped her overall pain in her forearm.  She could not wear the brace because of work  Once the prednisone Dosepak was completed she started to have more pain in the forearm again.  Previous history: 58 year old female comes in with vague discomfort right arm antecubital fossa posterior upper arm radiating down towards the wrist.  Status post open treatment internal fixation right distal radius several years ago via a volar approach  Comes in with pain over the last 5 to 6 months with weakness in the upper extremity painful pronation supination of the forearm.  She suffice severe but vague discomfort in the right upper extremity.  She notices some weakness in the right arm as well.    Review of Systems  Neurological: Negative for tremors, focal weakness and weakness.     Allergies  Allergen Reactions  . Beef-Derived Products   . Codeine Other (See Comments)    Made patient sleep alot  . Morphine Nausea And Vomiting  . Promethazine Hcl Other (See Comments)    Hallucinations  . Alprazolam Anxiety and Other (See Comments)    Caused had tremors     BP 118/83   Pulse 82   Ht 5' 8.5" (1.74 m)   Wt 183 lb (83 kg)   BMI 27.42 kg/m   Physical Exam Vitals signs reviewed.  Constitutional:      Appearance: She is well-developed.  Musculoskeletal:       Arms:  Neurological:     Mental Status: She is alert and oriented to person, place, and time.     Sensory: Sensation is intact.     Motor: Motor function is intact.  Psychiatric:        Attention and Perception: Attention normal.        Mood and Affect: Mood and affect  normal.        Speech: Speech normal.        Behavior: Behavior normal.        Thought Content: Thought content normal.        Judgment: Judgment normal.      Medical decisions:     Encounter Diagnosis  Name Primary?  . Radial tunnel syndrome of right upper extremity Yes    PLAN:   Inject point of maximal tenderness just distal to the radial head at the  Supinator proximal edge  Procedure note injection for right elbow  Diagnosis radial tunnel syndrome  Anesthesia ethyl chloride was used Alcohol use is clean the skin  After we obtained verbal consent and timeout a 25-gauge needle was used to inject 40 mg of Depo-Medrol and 3 cc of 1% lidocaine at the proximal edge of the radial tunnel There were no complications and a sterile bandage was applied.  Encounter Diagnosis  Name Primary?  . Radial tunnel syndrome of right upper extremity Yes    She will continue with Lyrica 3 times a day take prednisone 10 mg a day rest ice follow-up in 4 weeks  Arther Abbott, MD 01/01/2019 5:02 PM

## 2019-01-01 NOTE — Patient Instructions (Signed)
Continue Lyrica 3 times a day  Use ice as needed daily  Prednisone 10 mg daily

## 2019-01-29 ENCOUNTER — Ambulatory Visit: Payer: BLUE CROSS/BLUE SHIELD | Admitting: Orthopedic Surgery

## 2019-02-10 ENCOUNTER — Ambulatory Visit: Payer: BLUE CROSS/BLUE SHIELD | Admitting: Orthopedic Surgery

## 2019-02-23 ENCOUNTER — Other Ambulatory Visit: Payer: Self-pay | Admitting: Orthopedic Surgery

## 2019-02-23 DIAGNOSIS — G5631 Lesion of radial nerve, right upper limb: Secondary | ICD-10-CM

## 2019-03-12 DIAGNOSIS — R110 Nausea: Secondary | ICD-10-CM | POA: Diagnosis not present

## 2019-03-12 DIAGNOSIS — N390 Urinary tract infection, site not specified: Secondary | ICD-10-CM | POA: Diagnosis not present

## 2019-03-13 DIAGNOSIS — N390 Urinary tract infection, site not specified: Secondary | ICD-10-CM | POA: Diagnosis not present

## 2019-03-17 DIAGNOSIS — R05 Cough: Secondary | ICD-10-CM | POA: Diagnosis not present

## 2019-03-17 DIAGNOSIS — R509 Fever, unspecified: Secondary | ICD-10-CM | POA: Diagnosis not present

## 2019-03-25 ENCOUNTER — Other Ambulatory Visit: Payer: Self-pay

## 2019-03-25 ENCOUNTER — Ambulatory Visit (HOSPITAL_COMMUNITY)
Admission: RE | Admit: 2019-03-25 | Discharge: 2019-03-25 | Disposition: A | Discharge status: Home / Self Care | Payer: BLUE CROSS/BLUE SHIELD | Source: Ambulatory Visit | Attending: Internal Medicine | Admitting: Internal Medicine

## 2019-03-25 ENCOUNTER — Other Ambulatory Visit (HOSPITAL_COMMUNITY): Payer: Self-pay | Admitting: Internal Medicine

## 2019-03-25 ENCOUNTER — Encounter (HOSPITAL_COMMUNITY): Payer: Self-pay

## 2019-03-25 DIAGNOSIS — R509 Fever, unspecified: Secondary | ICD-10-CM | POA: Diagnosis not present

## 2019-03-25 DIAGNOSIS — R0602 Shortness of breath: Secondary | ICD-10-CM | POA: Diagnosis not present

## 2019-03-25 DIAGNOSIS — R05 Cough: Secondary | ICD-10-CM

## 2019-03-25 DIAGNOSIS — R059 Cough, unspecified: Secondary | ICD-10-CM

## 2019-03-31 DIAGNOSIS — R110 Nausea: Secondary | ICD-10-CM | POA: Diagnosis not present

## 2019-03-31 DIAGNOSIS — R05 Cough: Secondary | ICD-10-CM | POA: Diagnosis not present

## 2019-03-31 DIAGNOSIS — R5383 Other fatigue: Secondary | ICD-10-CM | POA: Diagnosis not present

## 2019-03-31 DIAGNOSIS — R509 Fever, unspecified: Secondary | ICD-10-CM | POA: Diagnosis not present

## 2019-04-01 DIAGNOSIS — R0600 Dyspnea, unspecified: Secondary | ICD-10-CM | POA: Diagnosis not present

## 2019-04-01 DIAGNOSIS — R05 Cough: Secondary | ICD-10-CM | POA: Diagnosis not present

## 2019-04-01 DIAGNOSIS — R0789 Other chest pain: Secondary | ICD-10-CM | POA: Diagnosis not present

## 2019-04-07 ENCOUNTER — Other Ambulatory Visit (HOSPITAL_COMMUNITY): Payer: Self-pay | Admitting: Internal Medicine

## 2019-04-07 DIAGNOSIS — R0600 Dyspnea, unspecified: Secondary | ICD-10-CM | POA: Diagnosis not present

## 2019-04-07 DIAGNOSIS — R509 Fever, unspecified: Secondary | ICD-10-CM | POA: Diagnosis not present

## 2019-04-07 DIAGNOSIS — R05 Cough: Secondary | ICD-10-CM | POA: Diagnosis not present

## 2019-04-16 ENCOUNTER — Emergency Department (HOSPITAL_COMMUNITY): Payer: BLUE CROSS/BLUE SHIELD

## 2019-04-16 ENCOUNTER — Other Ambulatory Visit: Payer: Self-pay

## 2019-04-16 ENCOUNTER — Emergency Department (HOSPITAL_COMMUNITY)
Admission: EM | Admit: 2019-04-16 | Discharge: 2019-04-16 | Disposition: A | Discharge status: Home / Self Care | Payer: BLUE CROSS/BLUE SHIELD | Attending: Emergency Medicine | Admitting: Emergency Medicine

## 2019-04-16 ENCOUNTER — Encounter (HOSPITAL_COMMUNITY): Payer: Self-pay

## 2019-04-16 DIAGNOSIS — R509 Fever, unspecified: Secondary | ICD-10-CM | POA: Diagnosis not present

## 2019-04-16 DIAGNOSIS — R0602 Shortness of breath: Secondary | ICD-10-CM | POA: Diagnosis not present

## 2019-04-16 DIAGNOSIS — F17210 Nicotine dependence, cigarettes, uncomplicated: Secondary | ICD-10-CM | POA: Insufficient documentation

## 2019-04-16 DIAGNOSIS — J219 Acute bronchiolitis, unspecified: Secondary | ICD-10-CM | POA: Insufficient documentation

## 2019-04-16 LAB — COMPREHENSIVE METABOLIC PANEL
ALT: 11 U/L (ref 0–44)
AST: 14 U/L — ABNORMAL LOW (ref 15–41)
Albumin: 4 g/dL (ref 3.5–5.0)
Alkaline Phosphatase: 60 U/L (ref 38–126)
Anion gap: 10 (ref 5–15)
BUN: 9 mg/dL (ref 6–20)
CO2: 24 mmol/L (ref 22–32)
Calcium: 8.9 mg/dL (ref 8.9–10.3)
Chloride: 104 mmol/L (ref 98–111)
Creatinine, Ser: 0.73 mg/dL (ref 0.44–1.00)
GFR calc Af Amer: >60 mL/min (ref >60)
GFR calc non Af Amer: >60 mL/min (ref >60)
Glucose, Bld: 136 mg/dL — ABNORMAL HIGH (ref 70–99)
Potassium: 3.3 mmol/L — ABNORMAL LOW (ref 3.5–5.1)
Sodium: 138 mmol/L (ref 135–145)
Total Bilirubin: 0.4 mg/dL (ref 0.3–1.2)
Total Protein: 6.8 g/dL (ref 6.5–8.1)

## 2019-04-16 LAB — CBC WITH DIFFERENTIAL/PLATELET
Abs Immature Granulocytes: 0.06 10*3/uL (ref 0.00–0.07)
Basophils Absolute: 0.1 10*3/uL (ref 0.0–0.1)
Basophils Relative: 1 %
Eosinophils Absolute: 0.1 10*3/uL (ref 0.0–0.5)
Eosinophils Relative: 1 %
HCT: 40.3 % (ref 36.0–46.0)
Hemoglobin: 13.8 g/dL (ref 12.0–15.0)
Immature Granulocytes: 0 %
Lymphocytes Relative: 26 %
Lymphs Abs: 4.6 10*3/uL — ABNORMAL HIGH (ref 0.7–4.0)
MCH: 31.4 pg (ref 26.0–34.0)
MCHC: 34.2 g/dL (ref 30.0–36.0)
MCV: 91.6 fL (ref 80.0–100.0)
Monocytes Absolute: 1.3 10*3/uL — ABNORMAL HIGH (ref 0.1–1.0)
Monocytes Relative: 7 %
Neutro Abs: 11.1 10*3/uL — ABNORMAL HIGH (ref 1.7–7.7)
Neutrophils Relative %: 65 %
Platelets: 490 10*3/uL — ABNORMAL HIGH (ref 150–400)
RBC: 4.4 MIL/uL (ref 3.87–5.11)
RDW: 13.3 % (ref 11.5–15.5)
WBC: 17.3 10*3/uL — ABNORMAL HIGH (ref 4.0–10.5)
nRBC: 0 % (ref 0.0–0.2)

## 2019-04-16 MED ORDER — FLUTICASONE PROPIONATE HFA 110 MCG/ACT IN AERO: 2.0000 | INHALATION_SPRAY | Freq: Two times a day (BID) | RESPIRATORY_TRACT | 2 refills | Status: DC

## 2019-04-16 MED ORDER — ALBUTEROL SULFATE HFA 108 (90 BASE) MCG/ACT IN AERS: 1.0000 | INHALATION_SPRAY | Freq: Four times a day (QID) | RESPIRATORY_TRACT | 0 refills | Status: DC | PRN

## 2019-04-16 MED ORDER — IOHEXOL 350 MG/ML SOLN
100.0000 mL | Freq: Once | INTRAVENOUS | Status: AC | PRN
  Administered 2019-04-16: 100 mL via INTRAVENOUS

## 2019-04-16 NOTE — ED Triage Notes (Signed)
Pt reports that she reports she has had a fever of 99-100.6 since march 30th. Reports she has been on 3 abt since then. The last on was levaquin that was completed on Monday. She reports episodes of chest tightness, diaphoresis, vomitng, aches and neck pain. Pt is followed by [facility] and says EKG was ok and cbc was abnormal

## 2019-04-16 NOTE — ED Notes (Signed)
Patient eating and drinking at this time

## 2019-04-16 NOTE — Discharge Instructions (Signed)
See Dr. Anastasio Champion for recheck.  Try using inhalers for symptoms.

## 2019-04-16 NOTE — ED Provider Notes (Signed)
[facility] EMERGENCY DEPARTMENT Provider Note   CSN: [id number] Arrival date & time: 04/16/19  1253    History   Chief Complaint Chief Complaint  Patient presents with  . Fever    HPI Becky Gutierrez is a 58 y.o. female.     The history is provided by the patient. No language interpreter was used.  Fever  Temp source:  Subjective Severity:  Moderate Onset quality:  Gradual Timing:  Constant Progression:  Worsening Chronicity:  New Relieved by:  Nothing Worsened by:  Nothing Ineffective treatments:  None tried Associated symptoms: congestion, cough and myalgias   Risk factors: recent sickness   Pt reports she has been treated with 3 antibiotics in the last month.  Pt reports she has had a fever since March 30.  Pt reports she continues to feel short of breath.  Pt reports she chest feels tight.  Pt reports body/muscleaches.  Dr. Anastasio Champion advised pt to come here to have a ct scan of her chest.  Pt has a history of breast cancer.   Pt also  reports she has pain in her right upper abdomen around her liver and pain on left side near her spleen.  Pt states she has had skin itching for the past year.   Past Medical History:  Diagnosis Date  . Bipolar 1 disorder ([place])   . Cancer ([place])    breast  . COPD (chronic obstructive pulmonary disease) [facility])     Patient Active Problem List   Diagnosis Date Noted  . Esophageal dysphagia 09/17/2018  . Nausea without vomiting 09/17/2018  . Rectal bleeding 09/17/2018  . Tear of MCL (medial collateral ligament) of knee 07/23/2012  . Fracture, humerus, greater tuberosity 02/06/2012  . Fracture of greater tuberosity of humerus 01/03/2012  . SHOULDER PAIN 01/22/2008  . IMPINGEMENT SYNDROME 01/22/2008    Past Surgical History:  Procedure Laterality Date  . ABDOMINAL HYSTERECTOMY    . APPENDECTOMY    . BREAST LUMPECTOMY     left side  . CESAREAN SECTION    . CHOLECYSTECTOMY    . COLONOSCOPY N/A 10/21/2018   Procedure: COLONOSCOPY;   Surgeon: Rogene Houston, MD;  Location: [facility] ENDO SUITE;  Service: Endoscopy;  Laterality: N/A;  12:00-rescheduled 11/11 @ 7:30am per Lelon Frohlich  . ESOPHAGOGASTRODUODENOSCOPY N/A 10/21/2018   Procedure: ESOPHAGOGASTRODUODENOSCOPY (EGD);  Surgeon: Rogene Houston, MD;  Location: [facility] ENDO SUITE;  Service: Endoscopy;  Laterality: N/A;  . HEMORROIDECTOMY    . POLYPECTOMY  10/21/2018   Procedure: POLYPECTOMY;  Surgeon: Rogene Houston, MD;  Location: [facility] ENDO SUITE;  Service: Endoscopy;;  colon     OB History    Gravida  2   Para  2   Term  2   Preterm      AB      Living  2     SAB      TAB      Ectopic      Multiple      Live Births               Home Medications    Prior to Admission medications   Medication Sig Start Date End Date Taking? Authorizing Provider  Cholecalciferol (VITAMIN D PO) Take 5,000 Units by mouth daily.    Yes [provider]  estradiol (ESTRACE) 1 MG tablet Take 1 mg by mouth daily.   Yes [provider]  ibuprofen (ADVIL,MOTRIN) 200 MG tablet Take 800 mg by mouth every 6 (  six) hours as needed. For pain    Yes [provider]  Prenatal MV & Min w/FA-DHA (PRENATAL ADULT GUMMY/DHA/FA PO) Take 2 each by mouth daily.   Yes [provider]  thyroid (ARMOUR) 30 MG tablet Take 30 mg by mouth daily before breakfast.   Yes [provider]  predniSONE (DELTASONE) 10 MG tablet TAKE 1 TABLET BY MOUTH EVERY DAY Patient not taking: Reported on 04/16/2019 02/24/19   Carole Civil, MD    Family History Family History  Problem Relation Age of Onset  . Diabetes Father   . Heart disease Other     Social History Social History   Tobacco Use  . Smoking status: Current Every Day Smoker    Packs/day: 0.75    Years: 30.00    Pack years: 22.50    Types: Cigarettes  . Smokeless tobacco: Never Used  Substance Use Topics  . Alcohol use: No  . Drug use: No     Allergies   Beef-derived products; Codeine;  Morphine; Promethazine hcl; and Alprazolam   Review of Systems Review of Systems  Constitutional: Positive for fever.  HENT: Positive for congestion.   Respiratory: Positive for cough.   Musculoskeletal: Positive for myalgias.  All other systems reviewed and are negative.    Physical Exam Updated Vital Signs BP 118/74   Pulse 80   Temp 99.1 F (37.3 C) (Oral)   Resp (!) 8   Ht 5\' 9"  (1.753 m)   Wt 89.4 kg   SpO2 97%   BMI 29.09 kg/m   Physical Exam Vitals signs and nursing note reviewed.  Constitutional:      Appearance: She is well-developed.  HENT:     Head: Normocephalic.     Right Ear: Tympanic membrane normal.     Left Ear: Tympanic membrane normal.     Nose: Nose normal.     Mouth/Throat:     Mouth: Mucous membranes are moist.  Eyes:     Pupils: Pupils are equal, round, and reactive to light.  Neck:     Musculoskeletal: Normal range of motion.  Cardiovascular:     Rate and Rhythm: Normal rate and regular rhythm.  Pulmonary:     Effort: Pulmonary effort is normal.     Breath sounds: Normal breath sounds.  Abdominal:     General: Abdomen is flat. There is no distension.  Musculoskeletal: Normal range of motion.  Skin:    General: Skin is warm.  Neurological:     Mental Status: She is alert and oriented to person, place, and time.  Psychiatric:        Mood and Affect: Mood normal.      ED Treatments / Results  Labs (all labs ordered are listed, but only abnormal results are displayed) Labs Reviewed  CBC WITH DIFFERENTIAL/PLATELET - Abnormal; Notable for the following components:      Result Value   WBC 17.3 (*)    Platelets 490 (*)    Neutro Abs 11.1 (*)    Lymphs Abs 4.6 (*)    Monocytes Absolute 1.3 (*)    All other components within normal limits  COMPREHENSIVE METABOLIC PANEL - Abnormal; Notable for the following components:   Potassium 3.3 (*)    Glucose, Bld 136 (*)    AST 14 (*)    All other components within normal limits    EKG  EKG Interpretation  Date/Time:  Wednesday Apr 16 2019 14:03:29 EDT Ventricular Rate:  82 PR Interval:  QRS Duration: 97 QT Interval:  397 QTC Calculation: 464 R Axis:   47 Text Interpretation:  Sinus rhythm Baseline wander When compared with ECG of 03/22/2011 No significant change was found Confirmed by Francine Graven [phone]) on 04/16/2019 2:37:14 PM   Radiology No results found.  Procedures Procedures (including critical care time)  Medications Ordered in ED Medications - No data to display   Initial Impression / Assessment and Plan / ED Course  I have reviewed the triage vital signs and the nursing notes.  Pertinent labs & imaging results that were available during my care of the patient were reviewed by me and considered in my medical decision making (see chart for details).        MDM  Pt has elevated wbc count and elevated platelets.  No elevation of liver functions. Ct angio chest pending  Final Clinical Impressions(s) / ED Diagnoses   Final diagnoses:  None    ED Discharge Orders    None       Fransico Meadow, [place] 04/16/19 [place], [place], DO 04/19/19 1909

## 2019-04-18 DIAGNOSIS — Z20828 Contact with and (suspected) exposure to other viral communicable diseases: Secondary | ICD-10-CM | POA: Diagnosis not present

## 2019-04-29 ENCOUNTER — Other Ambulatory Visit (HOSPITAL_COMMUNITY)
Admission: RE | Admit: 2019-04-29 | Discharge: 2019-04-29 | Disposition: A | Discharge status: Home / Self Care | Payer: BLUE CROSS/BLUE SHIELD | Source: Ambulatory Visit | Attending: Pulmonary Disease | Admitting: Pulmonary Disease

## 2019-04-29 DIAGNOSIS — R0602 Shortness of breath: Secondary | ICD-10-CM | POA: Insufficient documentation

## 2019-04-29 DIAGNOSIS — R509 Fever, unspecified: Secondary | ICD-10-CM | POA: Diagnosis not present

## 2019-04-29 DIAGNOSIS — Z853 Personal history of malignant neoplasm of breast: Secondary | ICD-10-CM | POA: Diagnosis not present

## 2019-04-29 DIAGNOSIS — F17200 Nicotine dependence, unspecified, uncomplicated: Secondary | ICD-10-CM | POA: Insufficient documentation

## 2019-04-29 LAB — CBC
HCT: 41.3 % (ref 36.0–46.0)
Hemoglobin: 14.2 g/dL (ref 12.0–15.0)
MCH: 31.5 pg (ref 26.0–34.0)
MCHC: 34.4 g/dL (ref 30.0–36.0)
MCV: 91.6 fL (ref 80.0–100.0)
Platelets: 543 10*3/uL — ABNORMAL HIGH (ref 150–400)
RBC: 4.51 MIL/uL (ref 3.87–5.11)
RDW: 13.2 % (ref 11.5–15.5)
WBC: 15 10*3/uL — ABNORMAL HIGH (ref 4.0–10.5)
nRBC: 0 % (ref 0.0–0.2)

## 2019-04-29 LAB — EXPECTORATED SPUTUM ASSESSMENT W GRAM STAIN, RFLX TO RESP C

## 2019-04-29 LAB — COMPREHENSIVE METABOLIC PANEL
ALT: 12 U/L (ref 0–44)
AST: 13 U/L — ABNORMAL LOW (ref 15–41)
Albumin: 4.2 g/dL (ref 3.5–5.0)
Alkaline Phosphatase: 65 U/L (ref 38–126)
Anion gap: 11 (ref 5–15)
BUN: 12 mg/dL (ref 6–20)
CO2: 23 mmol/L (ref 22–32)
Calcium: 9.1 mg/dL (ref 8.9–10.3)
Chloride: 103 mmol/L (ref 98–111)
Creatinine, Ser: 0.7 mg/dL (ref 0.44–1.00)
GFR calc Af Amer: >60 mL/min (ref >60)
GFR calc non Af Amer: >60 mL/min (ref >60)
Glucose, Bld: 101 mg/dL — ABNORMAL HIGH (ref 70–99)
Potassium: 3.3 mmol/L — ABNORMAL LOW (ref 3.5–5.1)
Sodium: 137 mmol/L (ref 135–145)
Total Bilirubin: 0.3 mg/dL (ref 0.3–1.2)
Total Protein: 7 g/dL (ref 6.5–8.1)

## 2019-04-29 LAB — C-REACTIVE PROTEIN: CRP: 0.9 mg/dL (ref <1.0)

## 2019-04-29 LAB — SEDIMENTATION RATE: Sed Rate: 4 mm/hr (ref 0–22)

## 2019-04-29 LAB — CK: Total CK: 75 U/L (ref 38–234)

## 2019-05-02 LAB — CULTURE, RESPIRATORY W GRAM STAIN

## 2019-05-02 LAB — ASPERGILLUS ANTIBODY BY IMMUNODIFF
Aspergillus flavus: NEGATIVE
Aspergillus fumigatus, IgG: NEGATIVE
Aspergillus niger: NEGATIVE

## 2019-05-04 LAB — CULTURE, BLOOD (ROUTINE X 2)
Culture: NO GROWTH
Culture: NO GROWTH
Special Requests: ADEQUATE
Special Requests: ADEQUATE

## 2019-05-08 ENCOUNTER — Other Ambulatory Visit (HOSPITAL_COMMUNITY): Payer: Self-pay | Admitting: Pulmonary Disease

## 2019-05-08 ENCOUNTER — Other Ambulatory Visit: Payer: Self-pay | Admitting: Pulmonary Disease

## 2019-05-08 DIAGNOSIS — R634 Abnormal weight loss: Secondary | ICD-10-CM

## 2019-05-08 DIAGNOSIS — R509 Fever, unspecified: Secondary | ICD-10-CM

## 2019-05-09 ENCOUNTER — Other Ambulatory Visit: Payer: Self-pay

## 2019-05-09 ENCOUNTER — Encounter (HOSPITAL_COMMUNITY): Payer: Self-pay

## 2019-05-09 ENCOUNTER — Ambulatory Visit (HOSPITAL_COMMUNITY)
Admission: RE | Admit: 2019-05-09 | Discharge: 2019-05-09 | Disposition: A | Discharge status: Home / Self Care | Payer: BLUE CROSS/BLUE SHIELD | Source: Ambulatory Visit | Attending: Pulmonary Disease | Admitting: Pulmonary Disease

## 2019-05-09 DIAGNOSIS — R509 Fever, unspecified: Secondary | ICD-10-CM | POA: Diagnosis not present

## 2019-05-09 DIAGNOSIS — I700 Atherosclerosis of aorta: Secondary | ICD-10-CM | POA: Diagnosis not present

## 2019-05-09 DIAGNOSIS — R634 Abnormal weight loss: Secondary | ICD-10-CM | POA: Diagnosis not present

## 2019-05-09 MED ORDER — IOHEXOL 300 MG/ML  SOLN
100.0000 mL | Freq: Once | INTRAMUSCULAR | Status: AC | PRN
  Administered 2019-05-09: 100 mL via INTRAVENOUS

## 2019-05-30 ENCOUNTER — Encounter (HOSPITAL_COMMUNITY): Payer: Self-pay | Admitting: *Deleted

## 2019-06-02 ENCOUNTER — Encounter (HOSPITAL_COMMUNITY): Payer: Self-pay | Admitting: Hematology

## 2019-06-02 ENCOUNTER — Inpatient Hospital Stay (HOSPITAL_COMMUNITY): Payer: BC Managed Care – PPO

## 2019-06-02 ENCOUNTER — Inpatient Hospital Stay (HOSPITAL_COMMUNITY): Payer: BC Managed Care – PPO | Attending: Hematology | Admitting: Hematology

## 2019-06-02 ENCOUNTER — Other Ambulatory Visit: Payer: Self-pay

## 2019-06-02 VITALS — BP 137/88 | HR 103 | Temp 98.2°F | Resp 16 | Wt 200.9 lb

## 2019-06-02 DIAGNOSIS — Z8249 Family history of ischemic heart disease and other diseases of the circulatory system: Secondary | ICD-10-CM | POA: Insufficient documentation

## 2019-06-02 DIAGNOSIS — Z818 Family history of other mental and behavioral disorders: Secondary | ICD-10-CM | POA: Insufficient documentation

## 2019-06-02 DIAGNOSIS — D72828 Other elevated white blood cell count: Secondary | ICD-10-CM

## 2019-06-02 DIAGNOSIS — Z9049 Acquired absence of other specified parts of digestive tract: Secondary | ICD-10-CM | POA: Diagnosis not present

## 2019-06-02 DIAGNOSIS — Z820 Family history of epilepsy and other diseases of the nervous system: Secondary | ICD-10-CM | POA: Diagnosis not present

## 2019-06-02 DIAGNOSIS — Z79899 Other long term (current) drug therapy: Secondary | ICD-10-CM

## 2019-06-02 DIAGNOSIS — F17210 Nicotine dependence, cigarettes, uncomplicated: Secondary | ICD-10-CM | POA: Diagnosis not present

## 2019-06-02 DIAGNOSIS — Z808 Family history of malignant neoplasm of other organs or systems: Secondary | ICD-10-CM | POA: Insufficient documentation

## 2019-06-02 DIAGNOSIS — R634 Abnormal weight loss: Secondary | ICD-10-CM | POA: Diagnosis not present

## 2019-06-02 DIAGNOSIS — D72829 Elevated white blood cell count, unspecified: Secondary | ICD-10-CM | POA: Insufficient documentation

## 2019-06-02 DIAGNOSIS — Z853 Personal history of malignant neoplasm of breast: Secondary | ICD-10-CM

## 2019-06-02 DIAGNOSIS — R7989 Other specified abnormal findings of blood chemistry: Secondary | ICD-10-CM | POA: Diagnosis not present

## 2019-06-02 DIAGNOSIS — R509 Fever, unspecified: Secondary | ICD-10-CM | POA: Diagnosis not present

## 2019-06-02 DIAGNOSIS — J449 Chronic obstructive pulmonary disease, unspecified: Secondary | ICD-10-CM | POA: Diagnosis not present

## 2019-06-02 DIAGNOSIS — I700 Atherosclerosis of aorta: Secondary | ICD-10-CM

## 2019-06-02 DIAGNOSIS — Z833 Family history of diabetes mellitus: Secondary | ICD-10-CM | POA: Insufficient documentation

## 2019-06-02 DIAGNOSIS — Z885 Allergy status to narcotic agent status: Secondary | ICD-10-CM

## 2019-06-02 DIAGNOSIS — Z83511 Family history of glaucoma: Secondary | ICD-10-CM

## 2019-06-02 LAB — CBC WITH DIFFERENTIAL/PLATELET
Band Neutrophils: 0 %
Basophils Absolute: 0 10*3/uL (ref 0.0–0.1)
Basophils Relative: 0 %
Blasts: 0 %
Eosinophils Absolute: 0.2 10*3/uL (ref 0.0–0.5)
Eosinophils Relative: 1 %
HCT: 42.4 % (ref 36.0–46.0)
Hemoglobin: 14.3 g/dL (ref 12.0–15.0)
Lymphocytes Relative: 27 %
Lymphs Abs: 4.3 10*3/uL — ABNORMAL HIGH (ref 0.7–4.0)
MCH: 31.2 pg (ref 26.0–34.0)
MCHC: 33.7 g/dL (ref 30.0–36.0)
MCV: 92.4 fL (ref 80.0–100.0)
Metamyelocytes Relative: 0 %
Monocytes Absolute: 1.1 10*3/uL — ABNORMAL HIGH (ref 0.1–1.0)
Monocytes Relative: 7 %
Myelocytes: 0 %
Neutro Abs: 10.3 10*3/uL — ABNORMAL HIGH (ref 1.7–7.7)
Neutrophils Relative %: 65 %
Other: 0 %
Platelets: 489 10*3/uL — ABNORMAL HIGH (ref 150–400)
Promyelocytes Relative: 0 %
RBC: 4.59 MIL/uL (ref 3.87–5.11)
RDW: 13.5 % (ref 11.5–15.5)
WBC: 15.9 10*3/uL — ABNORMAL HIGH (ref 4.0–10.5)
nRBC: 0 % (ref 0.0–0.2)
nRBC: 0 /100{WBCs}

## 2019-06-02 LAB — COMPREHENSIVE METABOLIC PANEL WITH GFR
ALT: 13 U/L (ref 0–44)
AST: 17 U/L (ref 15–41)
Albumin: 4.1 g/dL (ref 3.5–5.0)
Alkaline Phosphatase: 64 U/L (ref 38–126)
Anion gap: 12 (ref 5–15)
BUN: 8 mg/dL (ref 6–20)
CO2: 21 mmol/L — ABNORMAL LOW (ref 22–32)
Calcium: 8.9 mg/dL (ref 8.9–10.3)
Chloride: 105 mmol/L (ref 98–111)
Creatinine, Ser: 0.74 mg/dL (ref 0.44–1.00)
GFR calc Af Amer: >60 mL/min
GFR calc non Af Amer: >60 mL/min
Glucose, Bld: 98 mg/dL (ref 70–99)
Potassium: 3.6 mmol/L (ref 3.5–5.1)
Sodium: 138 mmol/L (ref 135–145)
Total Bilirubin: 0.3 mg/dL (ref 0.3–1.2)
Total Protein: 7.1 g/dL (ref 6.5–8.1)

## 2019-06-02 LAB — LACTATE DEHYDROGENASE: LDH: 207 U/L — ABNORMAL HIGH (ref 98–192)

## 2019-06-02 LAB — SAVE SMEAR(SSMR), FOR PROVIDER SLIDE REVIEW

## 2019-06-02 NOTE — Assessment & Plan Note (Addendum)
1.  Leukocytosis and thrombocytosis: - She had consistently elevated leukocytosis and thrombocytosis since 2008. -Differential showed increase in neutrophils most of the time.  Last 2 occasions there was slight increase in lymphocytes. -She does report low-grade fevers of 99-100 since the beginning of April.  She also has some sweats both daytime and nighttime.  She reported weight loss of 5 pounds in the past 10 days.  Prior to that she had fluctuating weight. -She also reports bone pains which are diffuse. - She had a temperature of 100.6 on 03/10/2019, seen by Dr. Anastasio Champion who tried different antibiotics including Cipro, Z-Pak followed by Levaquin 750 mg for 10 days.  She was also tested for COVID-19 and was negative. - CT chest angios on 04/16/2019 was negative for PE.  Upper lobe predominant diffuse ill-defined centrilobular groundglass micro-nodularity throughout both lungs consistent with smoking. -She was subsequently evaluated by Dr. Luan Pulling. - CT of the abdomen and pelvis on 05/09/2019 was negative for any acute intra-abdominal process.  Was also negative for lymphadenopathy. -I will recheck a CBC with differential and review of smear.  We will check LDH.  We will send for Jak 2 V617F and other reflex mutation testing to rule out myeloproliferative disorders.  We will also check for BCR/ABL by FISH to rule out CML.  2.  Left breast cancer: - She had a T2N0 left breast cancer, status post lumpectomy on 05/30/2006, 2.3 cm, grade 3, ER/PR/HER-2 negative. -She was treated with 6 cycles of likely TAC chemotherapy. -Patient refused radiation therapy to the left breast. -Breast exam today shows left breast upper outer quadrant lumpectomy scar is well-healed.  No palpable masses in both breasts and no palpable adenopathy. -I will check tumor markers.  I will also talk to her about mammograms next visit.   3.  Health maintenance: -Colonoscopy on 10/21/2018 shows ascending colon sessile serrated polyp  and sigmoid colon hyperplastic polyp.  4.  Smoking history: -He smokes half to three fourths of a pack daily for the past 48 years. - I will talk to her about smoking cessation counseling.  She does not yet qualify for low-dose CT scans.

## 2019-06-02 NOTE — Patient Instructions (Signed)
[facility]  Discharge Instructions:  You saw Dr. katragadda today. _______________________________________________________________  Thank you for choosing [facility] at [facility] to provide your oncology and hematology care.  To afford each patient quality time with our providers, please arrive at least 15 minutes before your scheduled appointment.  You need to re-schedule your appointment if you arrive 10 or more minutes late.  We strive to give you quality time with our providers, and arriving late affects you and other patients whose appointments are after yours.  Also, if you no show three or more times for appointments you may be dismissed from the clinic.  Again, thank you for choosing [facility]. Our hope is that these requests will allow you access to exceptional care and in a timely manner. _______________________________________________________________  If you have questions after your visit, please contact our office at [phone] between the hours of 8:30 a.m. and 5:00 p.m. Voicemails left after 4:30 p.m. will not be returned until the following business day. _______________________________________________________________  For prescription refill requests, have your pharmacy contact our office. _______________________________________________________________  Recommendations made by the consultant and any test results will be sent to your referring physician. _______________________________________________________________ 

## 2019-06-02 NOTE — Progress Notes (Signed)
CONSULT NOTE  Patient Care Team: Doree Albee, MD as PCP - General (Internal Medicine)  CHIEF COMPLAINTS/PURPOSE OF CONSULTATION:  Abnormal labs.  HISTORY OF PRESENTING ILLNESS:  Becky Gutierrez 58 y.o. female is seen in consultation today for further work-up and management of abnormal labs.  She reported that she started developing fever on 03/10/2019, 100.6.  She was treated with ciprofloxacin followed by Z-Pak and Levaquin 750 mg for 10 days for continued low-grade fevers.  She is running a temperature of 99-100 degrees every day, predominantly in the evenings.  She reported weight loss of 5 pounds in the last 10 days.  Her weight prior to that was fluctuating up and down.  She also reported sweats during day and night.  She has bone pain in all her bones.  She went to the ER on 04/16/2019 and a CT of the chest showed negative for PE.  Did not show any adenopathy.  It did show some diffuse ill-defined centrilobular groundglass opacities.  She was referred to Dr. Luan Pulling.  CT of the abdomen and pelvis was done on 05/09/2019 was negative for adenopathy and other intra-abdominal process.  She had a history of left breast cancer in 2007 and was treated with 6 cycles of chemotherapy.  She denies any bleeding per rectum or melena.  She does not report any cough or hemoptysis.  She smokes about half to 3/4 pack of cigarettes per day for the past 50 years.  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.  She is currently on disability since all this symptoms have started happening.  She does report extreme fatigue also.  Her bone pains are mainly in the ankles, hips, back.  Appetite is 25%.  Energy levels are low.  MEDICAL HISTORY:  Past Medical History:  Diagnosis Date  . Bipolar 1 disorder ([place])   . Bipolar 1 disorder ([place])   . Cancer ([place])    breast  . COPD (chronic obstructive pulmonary disease) ([place])   . Thyroid disease    Low T3    SURGICAL HISTORY: Past  Surgical History:  Procedure Laterality Date  . ABDOMINAL HYSTERECTOMY    . APPENDECTOMY    . BREAST LUMPECTOMY     left side  . CESAREAN SECTION    . CHOLECYSTECTOMY    . COLONOSCOPY N/A 10/21/2018   Procedure: COLONOSCOPY;  Surgeon: Rogene Houston, MD;  [facility];  Service: Endoscopy;  Laterality: N/A;  12:00-rescheduled 11/11 @ 7:30am per Lelon Frohlich  . ESOPHAGOGASTRODUODENOSCOPY N/A 10/21/2018   Procedure: ESOPHAGOGASTRODUODENOSCOPY (EGD);  Surgeon: Rogene Houston, MD;  [facility];  Service: Endoscopy;  Laterality: N/A;  . HEMORROIDECTOMY    . POLYPECTOMY  10/21/2018   Procedure: POLYPECTOMY;  Surgeon: Rogene Houston, MD;  [facility];  Service: Endoscopy;;  colon    SOCIAL HISTORY: Social History   Socioeconomic History  . Marital status: Divorced    Spouse name: Not on file  . Number of children: 2  . Years of education: 47  . Highest education level: Not on file  Occupational History    Employer: [employer]  Social Needs  . Financial resource strain: Not hard at all  . Food insecurity    Worry: Never true    Inability: Never true  . Transportation needs    Medical: No    Non-medical: No  Tobacco Use  . Smoking status: Current Every Day Smoker  Packs/day: 0.75    Years: 30.00    Pack years: 22.50    Types: Cigarettes  . Smokeless tobacco: Never Used  Substance and Sexual Activity  . Alcohol use: No  . Drug use: No  . Sexual activity: Yes    Birth control/protection: Surgical  Lifestyle  . Physical activity    Days per week: 0 days    Minutes per session: 0 min  . Stress: Only a little  Relationships  . Social connections    Talks on phone: More than three times a week    Gets together: Three times a week    Attends religious service: 1 to 4 times per year    Active member of club or organization: No    Attends meetings of clubs or organizations: Never    Relationship status: Widowed  . Intimate partner violence     Fear of current or ex partner: No    Emotionally abused: No    Physically abused: No    Forced sexual activity: No  Other Topics Concern  . Not on file  Social History Narrative  . Not on file    FAMILY HISTORY: Family History  Problem Relation Age of Onset  . Diabetes Father   . Parkinson's disease Father   . Depression Father   . Heart disease Other   . Hypertension Mother   . Glaucoma Mother   . Basal cell carcinoma Mother   . Fibromyalgia Brother     ALLERGIES:  is allergic to beef-derived products; codeine; morphine; promethazine hcl; and alprazolam.  MEDICATIONS:  Current Outpatient Medications  Medication Sig Dispense Refill  . acetaminophen (TYLENOL) 500 MG tablet Take 500 mg by mouth every 6 (six) hours as needed.    Marland Kitchen albuterol (VENTOLIN HFA) 108 (90 Base) MCG/ACT inhaler Inhale 1-2 puffs into the lungs every 6 (six) hours as needed for wheezing or shortness of breath. 1 Inhaler 0  . Cholecalciferol (VITAMIN D PO) Take 5,000 Units by mouth daily.     Marland Kitchen estradiol (ESTRACE) 1 MG tablet Take 1 mg by mouth daily.    . fluticasone (FLOVENT HFA) 110 MCG/ACT inhaler Inhale 2 puffs into the lungs 2 (two) times daily. 1 Inhaler 2  . ibuprofen (ADVIL,MOTRIN) 200 MG tablet Take 800 mg by mouth every 6 (six) hours as needed. For pain     . predniSONE (DELTASONE) 10 MG tablet TAKE 1 TABLET BY MOUTH EVERY DAY (Patient not taking: Reported on 04/16/2019) 60 tablet 0  . Prenatal MV & Min w/FA-DHA (PRENATAL ADULT GUMMY/DHA/FA PO) Take 2 each by mouth daily.    Marland Kitchen thyroid (ARMOUR) 30 MG tablet Take 30 mg by mouth daily before breakfast.     No current facility-administered medications for this visit.     REVIEW OF SYSTEMS:   Constitutional: Positive for fevers, sweats and fatigue.  Positive for weight loss. Eyes: Denies blurriness of vision, double vision or watery eyes Ears, nose, mouth, throat, and face: Denies mucositis or sore throat Respiratory: Positive for dyspnea on  exertion.  Denies any cough or hemoptysis. Cardiovascular: Denies palpitation, chest discomfort or lower extremity swelling Gastrointestinal:  Denies nausea, heartburn or change in bowel habits Skin: Denies abnormal skin rashes Lymphatics: Denies new lymphadenopathy or easy bruising Neurological:Denies numbness, tingling or new weaknesses Behavioral/Psych: Mood is stable, no new changes  All other systems were reviewed with the patient and are negative.  PHYSICAL EXAMINATION: ECOG PERFORMANCE STATUS: 1 - Symptomatic but completely ambulatory  Vitals:  06/02/19 1303  BP: 137/88  Pulse: (!) 103  Resp: 16  Temp: 98.2 F (36.8 C)  SpO2: 98%   Filed Weights   06/02/19 1303  Weight: 200 lb 14.4 oz (91.1 kg)    GENERAL:alert, no distress and comfortable SKIN: skin color, texture, turgor are normal, no rashes or significant lesions EYES: normal, conjunctiva are pink and non-injected, sclera clear OROPHARYNX:no exudate, no erythema and lips, buccal mucosa, and tongue normal  NECK: supple, thyroid normal size, non-tender, without nodularity LYMPH:  no palpable lymphadenopathy in the cervical, axillary or inguinal LUNGS: clear to auscultation and percussion with normal breathing effort HEART: regular rate & rhythm and no murmurs and no lower extremity edema ABDOMEN:abdomen soft, non-tender and normal bowel sounds Musculoskeletal:no cyanosis of digits and no clubbing  PSYCH: alert & oriented x 3 with fluent speech NEURO: no focal motor/sensory deficits Left breast lumpectomy scar is within normal limits.  No palpable mass in bilateral breast.  No palpable adenopathy.  LABORATORY DATA:  I have reviewed the data as listed Recent Results (from the past 2160 hour(s))  CBC with Differential/Platelet     Status: Abnormal   Collection Time: 04/16/19  2:07 PM  Result Value Ref Range   WBC 17.3 (H) 4.0 - 10.5 K/uL   RBC 4.40 3.87 - 5.11 MIL/uL   Hemoglobin 13.8 12.0 - 15.0 g/dL   HCT  40.3 36.0 - 46.0 %   MCV 91.6 80.0 - 100.0 fL   MCH 31.4 26.0 - 34.0 pg   MCHC 34.2 30.0 - 36.0 g/dL   RDW 13.3 11.5 - 15.5 %   Platelets 490 (H) 150 - 400 K/uL   nRBC 0.0 0.0 - 0.2 %   Neutrophils Relative % 65 %   Neutro Abs 11.1 (H) 1.7 - 7.7 K/uL   Lymphocytes Relative 26 %   Lymphs Abs 4.6 (H) 0.7 - 4.0 K/uL   Monocytes Relative 7 %   Monocytes Absolute 1.3 (H) 0.1 - 1.0 K/uL   Eosinophils Relative 1 %   Eosinophils Absolute 0.1 0.0 - 0.5 K/uL   Basophils Relative 1 %   Basophils Absolute 0.1 0.0 - 0.1 K/uL   Immature Granulocytes 0 %   Abs Immature Granulocytes 0.06 0.00 - 0.07 K/uL    Comment: Performed at [facility], [address], [place], [place] [address]  Comprehensive metabolic panel     Status: Abnormal   Collection Time: 04/16/19  2:07 PM  Result Value Ref Range   Sodium 138 135 - 145 mmol/L   Potassium 3.3 (L) 3.5 - 5.1 mmol/L   Chloride 104 98 - 111 mmol/L   CO2 24 22 - 32 mmol/L   Glucose, Bld 136 (H) 70 - 99 mg/dL   BUN 9 6 - 20 mg/dL   Creatinine, Ser 0.73 0.44 - 1.00 mg/dL   Calcium 8.9 8.9 - 10.3 mg/dL   Total Protein 6.8 6.5 - 8.1 g/dL   Albumin 4.0 3.5 - 5.0 g/dL   AST 14 (L) 15 - 41 U/L   ALT 11 0 - 44 U/L   Alkaline Phosphatase 60 38 - 126 U/L   Total Bilirubin 0.4 0.3 - 1.2 mg/dL   GFR calc non Af Amer >60 >60 mL/min   GFR calc Af Amer >60 >60 mL/min   Anion gap 10 5 - 15    Comment: Performed at [facility], [address] [place], [place] [address]  CBC     Status: Abnormal   Collection Time: 04/29/19  3:04 PM  Result Value Ref Range   WBC 15.0 (H) 4.0 - 10.5 K/uL   RBC 4.51 3.87 - 5.11 MIL/uL   Hemoglobin 14.2 12.0 - 15.0 g/dL   HCT 41.3 36.0 - 46.0 %   MCV 91.6 80.0 - 100.0 fL   MCH 31.5 26.0 - 34.0 pg   MCHC 34.4 30.0 - 36.0 g/dL   RDW 13.2 11.5 - 15.5 %   Platelets 543 (H) 150 - 400 K/uL   nRBC 0.0 0.0 - 0.2 %    Comment: Performed at [facility], [address], [place], [place] [address]  Comprehensive metabolic panel      Status: Abnormal   Collection Time: 04/29/19  3:04 PM  Result Value Ref Range   Sodium 137 135 - 145 mmol/L   Potassium 3.3 (L) 3.5 - 5.1 mmol/L   Chloride 103 98 - 111 mmol/L   CO2 23 22 - 32 mmol/L   Glucose, Bld 101 (H) 70 - 99 mg/dL   BUN 12 6 - 20 mg/dL   Creatinine, Ser 0.70 0.44 - 1.00 mg/dL   Calcium 9.1 8.9 - 10.3 mg/dL   Total Protein 7.0 6.5 - 8.1 g/dL   Albumin 4.2 3.5 - 5.0 g/dL   AST 13 (L) 15 - 41 U/L   ALT 12 0 - 44 U/L   Alkaline Phosphatase 65 38 - 126 U/L   Total Bilirubin 0.3 0.3 - 1.2 mg/dL   GFR calc non Af Amer >60 >60 mL/min   GFR calc Af Amer >60 >60 mL/min   Anion gap 11 5 - 15    Comment: Performed at [facility], [address], [place], [place] [address]  CK     Status: None   Collection Time: 04/29/19  3:04 PM  Result Value Ref Range   Total CK 75 38 - 234 U/L    Comment: Performed at [facility], [address], [place], [place] [address]  C-reactive protein     Status: None   Collection Time: 04/29/19  3:04 PM  Result Value Ref Range   CRP 0.9 <1.0 mg/dL    Comment: Performed at [facility], [address], [place], [place] [address]  Sedimentation rate     Status: None   Collection Time: 04/29/19  3:04 PM  Result Value Ref Range   Sed Rate 4 0 - 22 mm/hr    Comment: Performed at [facility], [address], [place], [place] [address]  Culture, blood (routine x 2)     Status: None   Collection Time: 04/29/19  3:04 PM   Specimen: BLOOD RIGHT HAND  Result Value Ref Range   Specimen Description BLOOD RIGHT HAND    Special Requests      BOTTLES DRAWN AEROBIC AND ANAEROBIC Blood Culture adequate volume   Culture      NO GROWTH 5 DAYS Performed at [facility], [address], [place], [place] [address]    Report Status 05/04/2019 FINAL   Culture, blood (routine x 2)     Status: None   Collection Time: 04/29/19  3:06 PM   Specimen: Right Antecubital; Blood  Result Value Ref Range   Specimen Description RIGHT ANTECUBITAL    Special Requests       BOTTLES DRAWN AEROBIC AND ANAEROBIC Blood Culture adequate volume   Culture      NO GROWTH 5 DAYS Performed at [facility], [address] [place], [place] [address]    Report Status 05/04/2019 FINAL  Aspergillus antibody by immunodiff     Status: None   Collection Time: 04/29/19  3:14 PM  Result Value Ref Range   Aspergillus fumigatus, IgG Negative Neg:<1:1   Aspergillus flavus Negative Neg:<1:1   Aspergillus [place] Negative Neg:<1:1    Comment: (NOTE) Performed At: [facility] [place], [place] [id number] Rush Farmer MD OV:[id number]   Expectorated sputum assessment w rflx to resp cult     Status: None   Collection Time: 04/29/19  3:40 PM   Specimen: Expectorated Sputum  Result Value Ref Range   Specimen Description EXPECTORATED SPUTUM    Special Requests NONE    Sputum evaluation      THIS SPECIMEN IS ACCEPTABLE FOR SPUTUM CULTURE Performed at [facility] Performed at [facility], [address], [place], [place] [address]    Report Status 04/29/2019 FINAL   Culture, respiratory     Status: None   Collection Time: 04/29/19  3:40 PM  Result Value Ref Range   Specimen Description      EXPECTORATED SPUTUM Performed at [facility], [address], [place], [place] [address]    Special Requests      NONE Reflexed from [id number] Performed at [facility], [address], [place], [place] [address]    Gram Stain      FEW WBC PRESENT,BOTH PMN AND MONONUCLEAR MODERATE GRAM POSITIVE COCCI IN PAIRS FEW GRAM POSITIVE RODS FEW GRAM NEGATIVE RODS    Culture      MODERATE STREPTOCOCCUS GROUP C ABUNDANT Consistent with normal respiratory flora. Performed at [facility], [place] [address] [place], [place] [address]    Report Status 05/02/2019 FINAL   CBC with Differential/Platelet     Status: Abnormal (Preliminary result)   Collection Time: 06/02/19  2:18 PM  Result Value Ref Range   WBC 15.9 (H) 4.0 - 10.5 K/uL   RBC 4.59 3.87 -  5.11 MIL/uL   Hemoglobin 14.3 12.0 - 15.0 g/dL   HCT 42.4 36.0 - 46.0 %   MCV 92.4 80.0 - 100.0 fL   MCH 31.2 26.0 - 34.0 pg   MCHC 33.7 30.0 - 36.0 g/dL   RDW 13.5 11.5 - 15.5 %   Platelets 489 (H) 150 - 400 K/uL   nRBC 0.0 0.0 - 0.2 %    Comment: Performed at [facility], [address], [place], [place] [address]   Neutrophils Relative % PENDING %   Neutro Abs PENDING 1.7 - 7.7 K/uL   Band Neutrophils PENDING %   Lymphocytes Relative PENDING %   Lymphs Abs PENDING 0.7 - 4.0 K/uL   Monocytes Relative PENDING %   Monocytes Absolute PENDING 0.1 - 1.0 K/uL   Eosinophils Relative PENDING %   Eosinophils Absolute PENDING 0.0 - 0.5 K/uL   Basophils Relative PENDING %   Basophils Absolute PENDING 0.0 - 0.1 K/uL   WBC Morphology PENDING    RBC Morphology PENDING    Smear Review PENDING    Other PENDING %   nRBC PENDING 0 /100 WBC   Metamyelocytes Relative PENDING %   Myelocytes PENDING %   Promyelocytes Relative PENDING %   Blasts PENDING %    RADIOGRAPHIC STUDIES: I have personally reviewed the radiological images as listed and agreed with the findings in the report. Ct Abdomen Pelvis W Contrast  Result Date: 05/09/2019 CLINICAL DATA:  Fever and leukocytosis for the past 10 days. EXAM: CT ABDOMEN AND PELVIS WITH CONTRAST TECHNIQUE: Multidetector CT imaging of the  abdomen and pelvis was performed using the standard protocol following bolus administration of intravenous contrast. CONTRAST:  183m OMNIPAQUE IOHEXOL 300 MG/ML  SOLN COMPARISON:  CT abdomen pelvis dated Apr 25, 2002. FINDINGS: Lower chest: No acute abnormality. Hepatobiliary: No focal liver abnormality is seen. Small calcified granuloma in the right hepatic lobe. Status post cholecystectomy. No biliary dilatation. Pancreas: Unremarkable. No pancreatic ductal dilatation or surrounding inflammatory changes. Spleen: Tiny in size.  No focal abnormality. Adrenals/Urinary Tract: Adrenal glands are unremarkable. Kidneys are normal,  without renal calculi, focal lesion, or hydronephrosis. Bladder is unremarkable for the degree of distention. Stomach/Bowel: Stomach is within normal limits. Prior appendectomy. No evidence of bowel wall thickening, distention, or inflammatory changes. Vascular/Lymphatic: Aortic atherosclerosis. No pathologically enlarged abdominal or pelvic lymph nodes. Scattered subcentimeter retroperitoneal and mesenteric lymph nodes. Reproductive: Status post hysterectomy. No adnexal masses. Other: No abdominal wall hernia or abnormality. No abdominopelvic ascites. No pneumoperitoneum. Musculoskeletal: No acute or significant osseous findings. IMPRESSION: 1.  No acute intra-abdominal process.  No lymphadenopathy. 2.  Aortic atherosclerosis (ICD10-I70.0). Electronically Signed   By: WTitus DubinM.D.   On: 05/09/2019 08:37    ASSESSMENT & PLAN:  Leukocytosis 1.  Leukocytosis and thrombocytosis: - She had consistently elevated leukocytosis and thrombocytosis since 2008. -Differential showed increase in neutrophils most of the time.  Last 2 occasions there was slight increase in lymphocytes. -She does report low-grade fevers of 99-100 since the beginning of April.  She also has some sweats both daytime and nighttime.  She reported weight loss of 5 pounds in the past 10 days.  Prior to that she had fluctuating weight. -She also reports bone pains which are diffuse. - She had a temperature of 100.6 on 03/10/2019, seen by Dr. GAnastasio Championwho tried different antibiotics including Cipro, Z-Pak followed by Levaquin 750 mg for 10 days.  She was also tested for COVID-19 and was negative. - CT chest angios on 04/16/2019 was negative for PE.  Upper lobe predominant diffuse ill-defined centrilobular groundglass micro-nodularity throughout both lungs consistent with smoking. -She was subsequently evaluated by Dr. HLuan Pulling - CT of the abdomen and pelvis on 05/09/2019 was negative for any acute intra-abdominal process.  Was also negative  for lymphadenopathy. -I will recheck a CBC with differential and review of smear.  We will check LDH.  We will send for Jak 2 V617F and other reflex mutation testing to rule out myeloproliferative disorders.  We will also check for BCR/ABL by FISH to rule out CML.  2.  Left breast cancer: - She had a T2N0 left breast cancer, status post lumpectomy on 05/30/2006, 2.3 cm, grade 3, ER/PR/HER-2 negative. -She was treated with 6 cycles of likely TAC chemotherapy. -Patient refused radiation therapy to the left breast. -Breast exam today shows left breast upper outer quadrant lumpectomy scar is well-healed.  No palpable masses in both breasts and no palpable adenopathy. -I will check tumor markers.  I will also talk to her about mammograms next visit.   3.  Health maintenance: -Colonoscopy on 10/21/2018 shows ascending colon sessile serrated polyp and sigmoid colon hyperplastic polyp.  4.  Smoking history: -He smokes half to three fourths of a pack daily for the past 48 years. - I will talk to her about smoking cessation counseling.  She does not yet qualify for low-dose CT scans.     All questions were answered. The patient knows to call the clinic with any problems, questions or concerns.     SDerek Jack MD 06/02/19 3:04  PM   

## 2019-06-03 LAB — CANCER ANTIGEN 27.29: CA 27.29: 13.3 U/mL (ref 0.0–38.6)

## 2019-06-03 LAB — CANCER ANTIGEN 15-3: CA 15-3: 4 U/mL (ref 0.0–25.0)

## 2019-06-06 LAB — BCR-ABL1 FISH
Cells Analyzed: 200
Cells Counted: 200

## 2019-06-11 LAB — CALR + JAK2 E12-15 + MPL (REFLEXED)

## 2019-06-11 LAB — JAK2 V617F, W REFLEX TO CALR/E12/MPL

## 2019-06-24 ENCOUNTER — Other Ambulatory Visit: Payer: Self-pay

## 2019-06-24 ENCOUNTER — Inpatient Hospital Stay (HOSPITAL_COMMUNITY): Payer: BC Managed Care – PPO | Attending: Hematology | Admitting: Hematology

## 2019-06-24 ENCOUNTER — Encounter (HOSPITAL_COMMUNITY): Payer: Self-pay | Admitting: Hematology

## 2019-06-24 VITALS — BP 132/75 | HR 93 | Temp 98.0°F | Resp 18 | Wt 208.0 lb

## 2019-06-24 DIAGNOSIS — K5900 Constipation, unspecified: Secondary | ICD-10-CM

## 2019-06-24 DIAGNOSIS — Z833 Family history of diabetes mellitus: Secondary | ICD-10-CM | POA: Diagnosis not present

## 2019-06-24 DIAGNOSIS — Z853 Personal history of malignant neoplasm of breast: Secondary | ICD-10-CM | POA: Diagnosis not present

## 2019-06-24 DIAGNOSIS — Z83511 Family history of glaucoma: Secondary | ICD-10-CM

## 2019-06-24 DIAGNOSIS — Z818 Family history of other mental and behavioral disorders: Secondary | ICD-10-CM | POA: Diagnosis not present

## 2019-06-24 DIAGNOSIS — R0602 Shortness of breath: Secondary | ICD-10-CM

## 2019-06-24 DIAGNOSIS — Z8269 Family history of other diseases of the musculoskeletal system and connective tissue: Secondary | ICD-10-CM

## 2019-06-24 DIAGNOSIS — D473 Essential (hemorrhagic) thrombocythemia: Secondary | ICD-10-CM | POA: Diagnosis not present

## 2019-06-24 DIAGNOSIS — Z8249 Family history of ischemic heart disease and other diseases of the circulatory system: Secondary | ICD-10-CM | POA: Diagnosis not present

## 2019-06-24 DIAGNOSIS — G479 Sleep disorder, unspecified: Secondary | ICD-10-CM | POA: Diagnosis not present

## 2019-06-24 DIAGNOSIS — Z820 Family history of epilepsy and other diseases of the nervous system: Secondary | ICD-10-CM | POA: Diagnosis not present

## 2019-06-24 DIAGNOSIS — R05 Cough: Secondary | ICD-10-CM | POA: Diagnosis not present

## 2019-06-24 DIAGNOSIS — F17210 Nicotine dependence, cigarettes, uncomplicated: Secondary | ICD-10-CM | POA: Diagnosis not present

## 2019-06-24 DIAGNOSIS — Z79899 Other long term (current) drug therapy: Secondary | ICD-10-CM | POA: Diagnosis not present

## 2019-06-24 DIAGNOSIS — D72829 Elevated white blood cell count, unspecified: Secondary | ICD-10-CM | POA: Diagnosis not present

## 2019-06-24 DIAGNOSIS — R197 Diarrhea, unspecified: Secondary | ICD-10-CM

## 2019-06-24 NOTE — Progress Notes (Signed)
Becky Gutierrez,  [id number]   CLINIC:  Medical Oncology/Hematology  PCP:  Doree Albee, MD [place] [address] [phone]   REASON FOR VISIT:  Follow-up for leukocytosis and thrombocytosis.     INTERVAL HISTORY:  Becky Gutierrez 58 y.o. female returns for follow-up of leukocytosis and thrombocytosis and left breast cancer.  She is continuing to have low-grade fevers between 99 and 100 daily.  She also reports bone pains in her lower extremities.  She is very concerned that she has lymphoma.  We have done blood work at last visit.  She also reports some constipation with diarrhea.  Denies any weight loss.    REVIEW OF SYSTEMS:  Review of Systems  Respiratory: Positive for cough and shortness of breath.   Gastrointestinal: Positive for constipation and diarrhea.  Psychiatric/Behavioral: Positive for sleep disturbance.  All other systems reviewed and are negative.    PAST MEDICAL/SURGICAL HISTORY:  Past Medical History:  Diagnosis Date   Bipolar 1 disorder ([place])    Bipolar 1 disorder ([place])    Cancer ([place])    breast   COPD (chronic obstructive pulmonary disease) ([place])    Thyroid disease    Low T3   Past Surgical History:  Procedure Laterality Date   ABDOMINAL HYSTERECTOMY     APPENDECTOMY     BREAST LUMPECTOMY     left side   CESAREAN SECTION     CHOLECYSTECTOMY     COLONOSCOPY N/A 10/21/2018   Procedure: COLONOSCOPY;  Surgeon: Rogene Houston, MD;  [facility];  Service: Endoscopy;  Laterality: N/A;  12:00-rescheduled 11/11 @ 7:30am per Lelon Frohlich   ESOPHAGOGASTRODUODENOSCOPY N/A 10/21/2018   Procedure: ESOPHAGOGASTRODUODENOSCOPY (EGD);  Surgeon: Rogene Houston, MD;  [facility];  Service: Endoscopy;  Laterality: N/A;   HEMORROIDECTOMY     POLYPECTOMY  10/21/2018   Procedure: POLYPECTOMY;  Surgeon: Rogene Houston, MD;  [facility];  Service: Endoscopy;;   colon     SOCIAL HISTORY:  Social History   Socioeconomic History   Marital status: Divorced    Spouse name: Not on file   Number of children: 2   Years of education: 14   Highest education level: Not on file  Occupational History    Employer: [employer]  Social Needs   Financial resource strain: Not hard at all   Food insecurity    Worry: Never true    Inability: Never true   Transportation needs    Medical: No    Non-medical: No  Tobacco Use   Smoking status: Current Every Day Smoker    Packs/day: 0.75    Years: 30.00    Pack years: 22.50    Types: Cigarettes   Smokeless tobacco: Never Used  Substance and Sexual Activity   Alcohol use: No   Drug use: No   Sexual activity: Yes    Birth control/protection: Surgical  Lifestyle   Physical activity    Days per week: 0 days    Minutes per session: 0 min   Stress: Only a little  Relationships   Social connections    Talks on phone: More than three times a week    Gets together: Three times a week    Attends religious service: 1 to 4 times per year    Active member of club or organization: No    Attends meetings of clubs or organizations: Never    Relationship status: Widowed  Intimate partner violence    Fear of current or ex partner: No    Emotionally abused: No    Physically abused: No    Forced sexual activity: No  Other Topics Concern   Not on file  Social History Narrative   Not on file    FAMILY HISTORY:  Family History  Problem Relation Age of Onset   Diabetes Father    Parkinson's disease Father    Depression Father    Heart disease Other    Hypertension Mother    Glaucoma Mother    Basal cell carcinoma Mother    Fibromyalgia Brother     CURRENT MEDICATIONS:  Outpatient Encounter Medications as of 06/24/2019  Medication Sig Note   acetaminophen (TYLENOL) 500 MG tablet Take 500 mg by mouth every 6 (six) hours as needed.    albuterol (VENTOLIN HFA) 108 (90 Base)  MCG/ACT inhaler Inhale 1-2 puffs into the lungs every 6 (six) hours as needed for wheezing or shortness of breath.    Cholecalciferol (VITAMIN D PO) Take 5,000 Units by mouth daily.     fluticasone (FLOVENT HFA) 110 MCG/ACT inhaler Inhale 2 puffs into the lungs 2 (two) times daily.    ibuprofen (ADVIL,MOTRIN) 200 MG tablet Take 800 mg by mouth every 6 (six) hours as needed. For pain     Prenatal MV & Min w/FA-DHA (PRENATAL ADULT GUMMY/DHA/FA PO) Take 2 each by mouth daily.    estradiol (ESTRACE) 1 MG tablet Take 1 mg by mouth daily. 05/30/2019: This medication is on hold   predniSONE (DELTASONE) 10 MG tablet TAKE 1 TABLET BY MOUTH EVERY DAY (Patient not taking: Reported on 04/16/2019)    thyroid (ARMOUR) 30 MG tablet Take 30 mg by mouth daily before breakfast. 05/30/2019: This medication is on hold   No facility-administered encounter medications on file as of 06/24/2019.     ALLERGIES:  Allergies  Allergen Reactions   Beef-Derived Products    Codeine Other (See Comments)    Made patient sleep alot   Morphine Nausea And Vomiting   Promethazine Hcl Other (See Comments)    Hallucinations   Alprazolam Anxiety and Other (See Comments)    Caused had tremors     PHYSICAL EXAM:  ECOG Performance status: 1  Vitals:   06/24/19 1530  BP: 132/75  Pulse: 93  Resp: 18  Temp: 98 F (36.7 C)  SpO2: 97%   Filed Weights   06/24/19 1530  Weight: 208 lb (94.3 kg)    Physical Exam Vitals signs reviewed.  Constitutional:      Appearance: Normal appearance.  Cardiovascular:     Rate and Rhythm: Normal rate and regular rhythm.     Heart sounds: Normal heart sounds.  Pulmonary:     Effort: Pulmonary effort is normal.     Breath sounds: Normal breath sounds.  Abdominal:     General: There is no distension.     Palpations: Abdomen is soft. There is no mass.  Musculoskeletal:        General: No swelling.  Skin:    General: Skin is warm.  Neurological:     General: No focal  deficit present.     Mental Status: She is alert and oriented to person, place, and time.  Psychiatric:        Mood and Affect: Mood normal.        Behavior: Behavior normal.      LABORATORY DATA:  I have reviewed the labs as listed.  CBC  Component Value Date/Time   WBC 15.9 (H) 06/02/2019 1418   RBC 4.59 06/02/2019 1418   HGB 14.3 06/02/2019 1418   HCT 42.4 06/02/2019 1418   PLT 489 (H) 06/02/2019 1418   MCV 92.4 06/02/2019 1418   MCH 31.2 06/02/2019 1418   MCHC 33.7 06/02/2019 1418   RDW 13.5 06/02/2019 1418   LYMPHSABS 4.3 (H) 06/02/2019 1418   MONOABS 1.1 (H) 06/02/2019 1418   EOSABS 0.2 06/02/2019 1418   BASOSABS 0.0 06/02/2019 1418   CMP Latest Ref Rng & Units 06/02/2019 04/29/2019 04/16/2019  Glucose 70 - 99 mg/dL 98 101(H) 136(H)  BUN 6 - 20 mg/dL '8 12 9  '$ Creatinine 0.44 - 1.00 mg/dL 0.74 0.70 0.73  Sodium 135 - 145 mmol/L 138 137 138  Potassium 3.5 - 5.1 mmol/L 3.6 3.3(L) 3.3(L)  Chloride 98 - 111 mmol/L 105 103 104  CO2 22 - 32 mmol/L 21(L) 23 24  Calcium 8.9 - 10.3 mg/dL 8.9 9.1 8.9  Total Protein 6.5 - 8.1 g/dL 7.1 7.0 6.8  Total Bilirubin 0.3 - 1.2 mg/dL 0.3 0.3 0.4  Alkaline Phos 38 - 126 U/L 64 65 60  AST 15 - 41 U/L 17 13(L) 14(L)  ALT 0 - 44 U/L '13 12 11       '$ DIAGNOSTIC IMAGING:  I have independently reviewed the scans and discussed with the patient.   I have reviewed Venita Lick LPN's note and agree with the documentation.  I personally performed a face-to-face visit, made revisions and my assessment and plan is as follows.    ASSESSMENT & PLAN:   Leukocytosis 1.  Leukocytosis and thrombocytosis: -She has consistently elevated leukocytosis and thrombocytosis since 2008. - We discussed results of blood work from 06/02/2019 which was negative for Jak 2 V617F and BCR/ABL testing ruling out chronic myeloproliferative disorders. - She reports some weakness, leg pains and low-grade temperatures ranging between 99 and 100.  She did not have any  weight loss. - CT angiogram of the chest on 04/16/2019 was negative for PE.  Upper lobe predominant diffuse groundglass micro-nodularity throughout both lungs consistent with smoking.  CT of the abdomen and pelvis on 05/09/2019 was negative for any acute intra-abdominal process.  Negative for lymphadenopathy. - Elevated leukocytosis and thrombocytosis could be reactive secondary to smoking.  However because of her symptoms, I have recommended bone marrow aspiration and biopsy.  This will be done under sedation at [facility] long.  We will see her back after bone marrow biopsy to discuss results.  2.  Left breast cancer: - T2N0 left breast cancer, status post lumpectomy on 05/30/2006, 2.3 cm, grade 3, ER/PR/HER-2 negative, status post 6 cycles of likely TAC chemotherapy.  She refused radiation therapy to the left breast. -Breast exam at last visit showed left breast upper outer quadrant lumpectomy scar is well-healed with no palpable masses in both breasts.  No palpable adenopathy. -I have reviewed her tumor markers which are within normal limits from June 2020. -I will schedule her for screening mammograms.  3.  Health maintenance: -Colonoscopy on level on 2019 shows ascending colon sessile serrated polyp and sigmoid colon hyperplastic polyp.  4.  Smoking history: - She smoked half to three fourths of a pack daily for 48 years. -Most recent CT of the lung did not show any suspicious findings other than groundglass opacities which needs follow-up.  Total time spent is 25 minutes with more than 50% of the time spent face-to-face discussing lab results, further work-up and coordination of  care.    Orders placed this encounter:  Orders Placed This Encounter  Procedures   MM 3D SCREEN BREAST BILATERAL   CT Biopsy   CT BONE MARROW ASPIRATION   CBC with Differential      Derek Jack, MD [place] [phone]

## 2019-06-24 NOTE — Assessment & Plan Note (Addendum)
1.  Leukocytosis and thrombocytosis: -She has consistently elevated leukocytosis and thrombocytosis since 2008. - We discussed results of blood work from 06/02/2019 which was negative for Jak 2 V617F and BCR/ABL testing ruling out chronic myeloproliferative disorders. - She reports some weakness, leg pains and low-grade temperatures ranging between 99 and 100.  She did not have any weight loss. - CT angiogram of the chest on 04/16/2019 was negative for PE.  Upper lobe predominant diffuse groundglass micro-nodularity throughout both lungs consistent with smoking.  CT of the abdomen and pelvis on 05/09/2019 was negative for any acute intra-abdominal process.  Negative for lymphadenopathy. - Elevated leukocytosis and thrombocytosis could be reactive secondary to smoking.  However because of her symptoms, I have recommended bone marrow aspiration and biopsy.  This will be done under sedation at [facility] long.  We will see her back after bone marrow biopsy to discuss results.  2.  Left breast cancer: - T2N0 left breast cancer, status post lumpectomy on 05/30/2006, 2.3 cm, grade 3, ER/PR/HER-2 negative, status post 6 cycles of likely TAC chemotherapy.  She refused radiation therapy to the left breast. -Breast exam at last visit showed left breast upper outer quadrant lumpectomy scar is well-healed with no palpable masses in both breasts.  No palpable adenopathy. -I have reviewed her tumor markers which are within normal limits from June 2020. -I will schedule her for screening mammograms.  3.  Health maintenance: -Colonoscopy on level on 2019 shows ascending colon sessile serrated polyp and sigmoid colon hyperplastic polyp.  4.  Smoking history: - She smoked half to three fourths of a pack daily for 48 years. -Most recent CT of the lung did not show any suspicious findings other than groundglass opacities which needs follow-up.

## 2019-06-24 NOTE — Patient Instructions (Signed)
[place] at [facility] Discharge Instructions  You were seen today by Dr. Delton Coombes. He went over your recent lab results. He will schedule you for a bone marrow biopsy. He will see you back in 2 weeks for labs and follow up.   Thank you for choosing [place] at [facility] to provide your oncology and hematology care.  To afford each patient quality time with our provider, please arrive at least 15 minutes before your scheduled appointment time.   If you have a lab appointment with the [facility] please come in thru the  Main Entrance and check in at the main information desk  You need to re-schedule your appointment should you arrive 10 or more minutes late.  We strive to give you quality time with our providers, and arriving late affects you and other patients whose appointments are after yours.  Also, if you no show three or more times for appointments you may be dismissed from the clinic at the providers discretion.     Again, thank you for choosing [facility].  Our hope is that these requests will decrease the amount of time that you wait before being seen by our physicians.       _____________________________________________________________  Should you have questions after your visit to [facility], please contact our office at [phone] between the hours of 8:00 a.m. and 4:30 p.m.  Voicemails left after 4:00 p.m. will not be returned until the following business day.  For prescription refill requests, have your pharmacy contact our office and allow 72 hours.    Cancer Center Support Programs:   > Cancer Support Group  2nd Tuesday of the month 1pm-2pm, Journey Room

## 2019-06-29 ENCOUNTER — Other Ambulatory Visit: Payer: Self-pay | Admitting: Radiology

## 2019-07-01 ENCOUNTER — Ambulatory Visit (HOSPITAL_COMMUNITY)
Admission: RE | Admit: 2019-07-01 | Discharge: 2019-07-01 | Disposition: A | Discharge status: Home / Self Care | Payer: BC Managed Care – PPO | Source: Ambulatory Visit | Attending: Hematology | Admitting: Hematology

## 2019-07-01 ENCOUNTER — Encounter (HOSPITAL_COMMUNITY): Payer: Self-pay

## 2019-07-01 ENCOUNTER — Other Ambulatory Visit: Payer: Self-pay

## 2019-07-01 DIAGNOSIS — Z7951 Long term (current) use of inhaled steroids: Secondary | ICD-10-CM | POA: Insufficient documentation

## 2019-07-01 DIAGNOSIS — D7589 Other specified diseases of blood and blood-forming organs: Secondary | ICD-10-CM | POA: Diagnosis not present

## 2019-07-01 DIAGNOSIS — Z91018 Allergy to other foods: Secondary | ICD-10-CM | POA: Insufficient documentation

## 2019-07-01 DIAGNOSIS — F319 Bipolar disorder, unspecified: Secondary | ICD-10-CM | POA: Diagnosis not present

## 2019-07-01 DIAGNOSIS — Z9049 Acquired absence of other specified parts of digestive tract: Secondary | ICD-10-CM | POA: Insufficient documentation

## 2019-07-01 DIAGNOSIS — Z79899 Other long term (current) drug therapy: Secondary | ICD-10-CM | POA: Diagnosis not present

## 2019-07-01 DIAGNOSIS — D72829 Elevated white blood cell count, unspecified: Secondary | ICD-10-CM | POA: Insufficient documentation

## 2019-07-01 DIAGNOSIS — Z888 Allergy status to other drugs, medicaments and biological substances status: Secondary | ICD-10-CM | POA: Insufficient documentation

## 2019-07-01 DIAGNOSIS — J449 Chronic obstructive pulmonary disease, unspecified: Secondary | ICD-10-CM | POA: Diagnosis not present

## 2019-07-01 DIAGNOSIS — E079 Disorder of thyroid, unspecified: Secondary | ICD-10-CM | POA: Diagnosis not present

## 2019-07-01 DIAGNOSIS — D473 Essential (hemorrhagic) thrombocythemia: Secondary | ICD-10-CM | POA: Diagnosis not present

## 2019-07-01 DIAGNOSIS — Z853 Personal history of malignant neoplasm of breast: Secondary | ICD-10-CM | POA: Diagnosis not present

## 2019-07-01 DIAGNOSIS — Z885 Allergy status to narcotic agent status: Secondary | ICD-10-CM | POA: Diagnosis not present

## 2019-07-01 DIAGNOSIS — Z720 Tobacco use: Secondary | ICD-10-CM | POA: Diagnosis not present

## 2019-07-01 HISTORY — DX: Dyspnea, unspecified: R06.00

## 2019-07-01 HISTORY — DX: Other specified postprocedural states: R11.2

## 2019-07-01 HISTORY — DX: Other specified postprocedural states: Z98.890

## 2019-07-01 HISTORY — DX: Nausea with vomiting, unspecified: Z98.890

## 2019-07-01 LAB — CBC WITH DIFFERENTIAL/PLATELET
Abs Immature Granulocytes: 0.08 10*3/uL — ABNORMAL HIGH (ref 0.00–0.07)
Basophils Absolute: 0.1 10*3/uL (ref 0.0–0.1)
Basophils Relative: 1 %
Eosinophils Absolute: 0.4 10*3/uL (ref 0.0–0.5)
Eosinophils Relative: 3 %
HCT: 42.5 % (ref 36.0–46.0)
Hemoglobin: 14.1 g/dL (ref 12.0–15.0)
Immature Granulocytes: 1 %
Lymphocytes Relative: 26 %
Lymphs Abs: 3.6 10*3/uL (ref 0.7–4.0)
MCH: 31.1 pg (ref 26.0–34.0)
MCHC: 33.2 g/dL (ref 30.0–36.0)
MCV: 93.6 fL (ref 80.0–100.0)
Monocytes Absolute: 1.3 10*3/uL — ABNORMAL HIGH (ref 0.1–1.0)
Monocytes Relative: 9 %
Neutro Abs: 8.4 10*3/uL — ABNORMAL HIGH (ref 1.7–7.7)
Neutrophils Relative %: 60 %
Platelets: 536 10*3/uL — ABNORMAL HIGH (ref 150–400)
RBC: 4.54 MIL/uL (ref 3.87–5.11)
RDW: 14.2 % (ref 11.5–15.5)
WBC: 13.9 10*3/uL — ABNORMAL HIGH (ref 4.0–10.5)
nRBC: 0 % (ref 0.0–0.2)

## 2019-07-01 LAB — PROTIME-INR
INR: 0.9 (ref 0.8–1.2)
Prothrombin Time: 12.1 seconds (ref 11.4–15.2)

## 2019-07-01 MED ORDER — FENTANYL CITRATE (PF) 100 MCG/2ML IJ SOLN
INTRAMUSCULAR | Status: AC
  Filled 2019-07-01: qty 2

## 2019-07-01 MED ORDER — SODIUM CHLORIDE 0.9 % IV SOLN
INTRAVENOUS | Status: DC
  Administered 2019-07-01: 08:00:00 via INTRAVENOUS

## 2019-07-01 MED ORDER — MIDAZOLAM HCL 2 MG/2ML IJ SOLN
INTRAMUSCULAR | Status: AC | PRN
  Administered 2019-07-01 (×3): 1 mg via INTRAVENOUS

## 2019-07-01 MED ORDER — FENTANYL CITRATE (PF) 100 MCG/2ML IJ SOLN
INTRAMUSCULAR | Status: AC | PRN
  Administered 2019-07-01 (×2): 50 ug via INTRAVENOUS

## 2019-07-01 MED ORDER — MIDAZOLAM HCL 2 MG/2ML IJ SOLN
INTRAMUSCULAR | Status: AC
  Filled 2019-07-01: qty 4

## 2019-07-01 NOTE — H&P (Signed)
Referring Physician(s): Katragadda,Sreedhar  Supervising Physician: Corrie Mckusick  Patient Status:  [facility] OP  Chief Complaint: "I'm here for a bone marrow biopsy"   Subjective: Patient familiar to IR service from prior PICC placement in 2007.  She has a history of remote left breast cancer, tobacco abuse and now presents with leukocytosis, thrombocytosis, low-grade fevers, fatigue, abdominal and back pain.  She is scheduled today for CT-guided bone marrow biopsy for further evaluation.   Past Medical History:  Diagnosis Date  . Bipolar 1 disorder ([place])   . Bipolar 1 disorder ([place])   . Cancer ([place])    breast  . COPD (chronic obstructive pulmonary disease) ([place])   . Dyspnea    with exersion  . PONV (postoperative nausea and vomiting)    with morphine given  . Thyroid disease    Low T3   Past Surgical History:  Procedure Laterality Date  . ABDOMINAL HYSTERECTOMY    . APPENDECTOMY    . BREAST LUMPECTOMY     left side  . CESAREAN SECTION    . CHOLECYSTECTOMY    . COLONOSCOPY N/A 10/21/2018   Procedure: COLONOSCOPY;  Surgeon: Rogene Houston, MD;  [facility];  Service: Endoscopy;  Laterality: N/A;  12:00-rescheduled 11/11 @ 7:30am per Lelon Frohlich  . ESOPHAGOGASTRODUODENOSCOPY N/A 10/21/2018   Procedure: ESOPHAGOGASTRODUODENOSCOPY (EGD);  Surgeon: Rogene Houston, MD;  [facility];  Service: Endoscopy;  Laterality: N/A;  . HEMORROIDECTOMY    . POLYPECTOMY  10/21/2018   Procedure: POLYPECTOMY;  Surgeon: Rogene Houston, MD;  [facility];  Service: Endoscopy;;  colon      Allergies: Beef-derived products, Codeine, Morphine, Promethazine hcl, and Alprazolam  Medications: Prior to Admission medications   Medication Sig Start Date End Date Taking? Authorizing Provider  fluticasone (FLOVENT HFA) 110 MCG/ACT inhaler Inhale 2 puffs into the lungs 2 (two) times daily. 04/16/19 04/15/20 Yes Fransico Meadow, PA-C  acetaminophen (TYLENOL) 500 MG  tablet Take 500 mg by mouth every 6 (six) hours as needed.    [provider]  albuterol (VENTOLIN HFA) 108 (90 Base) MCG/ACT inhaler Inhale 1-2 puffs into the lungs every 6 (six) hours as needed for wheezing or shortness of breath. 04/16/19   Fransico Meadow, PA-C  Cholecalciferol (VITAMIN D PO) Take 5,000 Units by mouth daily.     [provider]  estradiol (ESTRACE) 1 MG tablet Take 1 mg by mouth daily.    [provider]  ibuprofen (ADVIL,MOTRIN) 200 MG tablet Take 800 mg by mouth every 6 (six) hours as needed. For pain     [provider]  predniSONE (DELTASONE) 10 MG tablet TAKE 1 TABLET BY MOUTH EVERY DAY Patient not taking: Reported on 04/16/2019 02/24/19   Carole Civil, MD  Prenatal MV & Min w/FA-DHA (PRENATAL ADULT GUMMY/DHA/FA PO) Take 2 each by mouth daily.    [provider]  thyroid (ARMOUR) 30 MG tablet Take 30 mg by mouth daily before breakfast.    [provider]     Vital Signs: BP 107/73   Pulse 82   Temp 98.2 F (36.8 C) (Oral)   Resp 16   SpO2 100%   Physical Exam awake, alert.  Chest with distant breath sounds bilaterally.  Heart with regular rate and rhythm.  Abdomen soft, positive bowel sounds, mild tenderness in the upper quadrants.  No significant lower extremity edema but does have tenderness with palpation of pretibial areas bilat  Imaging: No  results found.  Labs:  CBC: Recent Labs    04/16/19 1407 04/29/19 1504 06/02/19 1418 07/01/19 0745  WBC 17.3* 15.0* 15.9* 13.9*  HGB 13.8 14.2 14.3 14.1  HCT 40.3 41.3 42.4 42.5  PLT 490* 543* 489* 536*    COAGS: Recent Labs    07/01/19 0745  INR 0.9    BMP: Recent Labs    04/16/19 1407 04/29/19 1504 06/02/19 1418  NA 138 137 138  K 3.3* 3.3* 3.6  CL 104 103 105  CO2 24 23 21*  GLUCOSE 136* 101* 98  BUN _0 CALCIUM 8.9 9.1 8.9  CREATININE 0.73 0.70 0.74  GFRNONAA >60 >60 >60  GFRAA >60 >60 >60    LIVER FUNCTION TESTS: Recent  Labs    04/16/19 1407 04/29/19 1504 06/02/19 1418  BILITOT 0.4 0.3 0.3  AST 14* 13* 17  ALT _1 ALKPHOS 60 65 64  PROT 6.8 7.0 7.1  ALBUMIN 4.0 4.2 4.1    Assessment and Plan: Pt with history of remote left breast cancer, tobacco abuse and now presents with leukocytosis, thrombocytosis, low-grade fevers, fatigue, abdominal and back pain.  She is scheduled today for CT-guided bone marrow biopsy for further evaluation.Risks and benefits of procedure was discussed with the patient  including, but not limited to bleeding, infection, damage to adjacent structures or low yield requiring additional tests.  Patient states she is COVID-19 negative.  All of the questions were answered and there is agreement to proceed.  Consent signed and in chart.     Electronically Signed: D. Rowe Robert, PA-C 07/01/2019, 8:27 AM   I spent a total of 20 minutes at the the patient's bedside AND on the patient's hospital floor or unit, greater than 50% of which was counseling/coordinating care for CT-guided bone marrow biopsy

## 2019-07-01 NOTE — Procedures (Signed)
Interventional Radiology Procedure Note  Procedure: CT guided aspirate and core biopsy of right posterior iliac bone Complications: None Recommendations: - Bedrest supine x 1 hrs - OTC's PRN  Pain - Follow biopsy results  Signed,  Honestie Kulik S. Kabrina Christiano, DO    

## 2019-07-01 NOTE — Discharge Instructions (Signed)
Bone Marrow Aspiration and Bone Marrow Biopsy, Adult, Care After This sheet gives you information about how to care for yourself after your procedure. Your health care provider may also give you more specific instructions. If you have problems or questions, contact your health care provider. What can I expect after the procedure? After the procedure, it is common to have:  Mild pain and tenderness.  Swelling.  Bruising. Follow these instructions at home: Puncture site care      Follow instructions from your health care provider about how to take care of the puncture site. Make sure you: ? Wash your hands with soap and water before you change your bandage (dressing). If soap and water are not available, use hand sanitizer. ? Change your dressing as told by your health care provider.  Check your puncture siteevery day for signs of infection. Check for: ? More redness, swelling, or pain. ? More fluid or blood. ? Warmth. ? Pus or a bad smell. General instructions  Take over-the-counter and prescription medicines only as told by your health care provider.  Do not take baths, swim, or use a hot tub until your health care provider approves. Shower 07/02/2019 afternoon. Remove dressing after shower.  Return to your normal activities as told by your health care provider. Ask your health care provider what activities are safe for you.  Do not drive for 24 hours if you were given a medicine to help you relax (sedative) during your procedure.  Keep all follow-up visits as told by your health care provider. This is important. Contact a health care provider if:  Your pain is not controlled with medicine. Get help right away if:  You have a fever.  You have more redness, swelling, or pain around the puncture site.  You have more fluid or blood coming from the puncture site.  Your puncture site feels warm to the touch.  You have pus or a bad smell coming from the puncture  site. These symptoms may represent a serious problem that is an emergency. Do not wait to see if the symptoms will go away. Get medical help right away. Call your local emergency services (911 in the [place] Do not drive yourself to the hospital. Summary  After the procedure, it is common to have mild pain, tenderness, swelling, and bruising.  Follow instructions from your health care provider about how to take care of the puncture site.  Get help right away if you have any symptoms of infection or if you have more blood or fluid coming from the puncture site. This information is not intended to replace advice given to you by your health care provider. Make sure you discuss any questions you have with your health care provider. Document Released: 06/16/2005 Document Revised: 03/12/2018 Document Reviewed: 05/10/2016 Elsevier Patient Education  [place].       Moderate Conscious Sedation, Adult, Care After These instructions provide you with information about caring for yourself after your procedure. Your health care provider may also give you more specific instructions. Your treatment has been planned according to current medical practices, but problems sometimes occur. Call your health care provider if you have any problems or questions after your procedure. What can I expect after the procedure? After your procedure, it is common:  To feel sleepy for several hours.  To feel clumsy and have poor balance for several hours.  To have poor judgment for several hours.  To vomit if you eat too soon. Follow these instructions  at home: For at least 24 hours after the procedure:   Do not: ? Participate in activities where you could fall or become injured. ? Drive. ? Use heavy machinery. ? Drink alcohol. ? Take sleeping pills or medicines that cause drowsiness. ? Make important decisions or sign legal documents. ? Take care of children on your own.  Rest. Eating and  drinking  Follow the diet recommended by your health care provider.  If you vomit: ? Drink water, juice, or soup when you can drink without vomiting. ? Make sure you have little or no nausea before eating solid foods. General instructions  Have a responsible adult stay with you until you are awake and alert.  Take over-the-counter and prescription medicines only as told by your health care provider.  If you smoke, do not smoke without supervision.  Keep all follow-up visits as told by your health care provider. This is important. Contact a health care provider if:  You keep feeling nauseous or you keep vomiting.  You feel light-headed.  You develop a rash.  You have a fever. Get help right away if:  You have trouble breathing. This information is not intended to replace advice given to you by your health care provider. Make sure you discuss any questions you have with your health care provider. Document Released: 09/17/2013 Document Revised: 11/09/2017 Document Reviewed: 03/18/2016 Elsevier Patient Education  2020 [employer].

## 2019-07-04 ENCOUNTER — Ambulatory Visit (HOSPITAL_COMMUNITY)
Admission: RE | Admit: 2019-07-04 | Discharge: 2019-07-04 | Disposition: A | Discharge status: Home / Self Care | Payer: BC Managed Care – PPO | Source: Ambulatory Visit | Attending: Hematology | Admitting: Hematology

## 2019-07-04 ENCOUNTER — Encounter (HOSPITAL_COMMUNITY): Payer: Self-pay

## 2019-07-04 ENCOUNTER — Other Ambulatory Visit: Payer: Self-pay

## 2019-07-04 DIAGNOSIS — Z1231 Encounter for screening mammogram for malignant neoplasm of breast: Secondary | ICD-10-CM | POA: Diagnosis not present

## 2019-07-04 DIAGNOSIS — Z853 Personal history of malignant neoplasm of breast: Secondary | ICD-10-CM | POA: Insufficient documentation

## 2019-07-04 DIAGNOSIS — F17210 Nicotine dependence, cigarettes, uncomplicated: Secondary | ICD-10-CM | POA: Diagnosis not present

## 2019-07-04 DIAGNOSIS — D72829 Elevated white blood cell count, unspecified: Secondary | ICD-10-CM | POA: Insufficient documentation

## 2019-07-07 ENCOUNTER — Other Ambulatory Visit (HOSPITAL_COMMUNITY): Payer: Self-pay | Admitting: Hematology

## 2019-07-07 DIAGNOSIS — R928 Other abnormal and inconclusive findings on diagnostic imaging of breast: Secondary | ICD-10-CM

## 2019-07-08 ENCOUNTER — Encounter (HOSPITAL_COMMUNITY): Payer: Self-pay | Admitting: Hematology

## 2019-07-16 ENCOUNTER — Other Ambulatory Visit: Payer: Self-pay

## 2019-07-16 ENCOUNTER — Encounter (HOSPITAL_COMMUNITY): Payer: Self-pay | Admitting: Hematology

## 2019-07-16 ENCOUNTER — Inpatient Hospital Stay (HOSPITAL_COMMUNITY): Payer: Self-pay | Attending: Hematology | Admitting: Hematology

## 2019-07-16 VITALS — BP 137/86 | HR 92 | Temp 98.8°F | Resp 20

## 2019-07-16 DIAGNOSIS — D72829 Elevated white blood cell count, unspecified: Secondary | ICD-10-CM | POA: Insufficient documentation

## 2019-07-16 DIAGNOSIS — F17210 Nicotine dependence, cigarettes, uncomplicated: Secondary | ICD-10-CM | POA: Insufficient documentation

## 2019-07-16 DIAGNOSIS — Z90710 Acquired absence of both cervix and uterus: Secondary | ICD-10-CM | POA: Insufficient documentation

## 2019-07-16 DIAGNOSIS — Z853 Personal history of malignant neoplasm of breast: Secondary | ICD-10-CM | POA: Insufficient documentation

## 2019-07-16 DIAGNOSIS — Z171 Estrogen receptor negative status [ER-]: Secondary | ICD-10-CM | POA: Insufficient documentation

## 2019-07-16 DIAGNOSIS — Z79899 Other long term (current) drug therapy: Secondary | ICD-10-CM | POA: Insufficient documentation

## 2019-07-16 DIAGNOSIS — Z9221 Personal history of antineoplastic chemotherapy: Secondary | ICD-10-CM | POA: Insufficient documentation

## 2019-07-16 NOTE — Patient Instructions (Addendum)
[place] at [facility] Discharge Instructions  You were seen today by Dr. Delton Coombes. He went over your recent biopsy results. He will see you back in 3 months for labs and follow up.   Thank you for choosing [place] at [facility] to provide your oncology and hematology care.  To afford each patient quality time with our provider, please arrive at least 15 minutes before your scheduled appointment time.   If you have a lab appointment with the [place] please come in thru the  Main Entrance and check in at the main information desk  You need to re-schedule your appointment should you arrive 10 or more minutes late.  We strive to give you quality time with our providers, and arriving late affects you and other patients whose appointments are after yours.  Also, if you no show three or more times for appointments you may be dismissed from the clinic at the providers discretion.     Again, thank you for choosing [facility].  Our hope is that these requests will decrease the amount of time that you wait before being seen by our physicians.       _____________________________________________________________  Should you have questions after your visit to [facility], please contact our office at [phone] between the hours of 8:00 a.m. and 4:30 p.m.  Voicemails left after 4:00 p.m. will not be returned until the following business day.  For prescription refill requests, have your pharmacy contact our office and allow 72 hours.    Cancer Center Support Programs:   > Cancer Support Group  2nd Tuesday of the month 1pm-2pm, Journey Room

## 2019-07-16 NOTE — Progress Notes (Signed)
Becky Gutierrez, [name] [id number]   CLINIC:  Medical Oncology/Hematology  PCP:  Doree Albee, MD [place] [id number] [phone]   REASON FOR VISIT:  Follow-up for leukocytosis and thrombocytosis.     INTERVAL HISTORY:  [name] 58 y.o. female seen for follow-up of leukocytosis and thrombocytosis and left breast cancer.  She reports that she is continuing to have low-grade fevers between 99 and 100 every evening.  She also complains of diffuse bone pains, predominantly in the thighs and legs.  Appetite is 50%.  Energy levels are 75%.  Minor constipation is well controlled.  She did have a bone marrow aspirate done on 07/01/2019.  She had a mammogram done on 07/04/2019.    REVIEW OF SYSTEMS:  Review of Systems  Constitutional: Positive for fever.  Gastrointestinal: Positive for constipation.  All other systems reviewed and are negative.    PAST MEDICAL/SURGICAL HISTORY:  Past Medical History:  Diagnosis Date  . Bipolar 1 disorder ([place])   . Bipolar 1 disorder ([place])   . Cancer [facility])     left breast cancer  . COPD (chronic obstructive pulmonary disease) ([place])   . Dyspnea    with exersion  . PONV (postoperative nausea and vomiting)    with morphine given  . Thyroid disease    Low T3   Past Surgical History:  Procedure Laterality Date  . ABDOMINAL HYSTERECTOMY    . APPENDECTOMY    . BREAST LUMPECTOMY     left side  . CESAREAN SECTION    . CHOLECYSTECTOMY    . COLONOSCOPY N/A 10/21/2018   Procedure: COLONOSCOPY;  Surgeon: Rogene Houston, MD;  Location: [facility];  Service: Endoscopy;  Laterality: N/A;  12:00-rescheduled 11/11 @ 7:30am per Lelon Frohlich  . ESOPHAGOGASTRODUODENOSCOPY N/A 10/21/2018   Procedure: ESOPHAGOGASTRODUODENOSCOPY (EGD);  Surgeon: Rogene Houston, MD;  Location: [facility];  Service: Endoscopy;  Laterality: N/A;  . HEMORROIDECTOMY    . POLYPECTOMY  10/21/2018   Procedure: POLYPECTOMY;   Surgeon: Rogene Houston, MD;  Location: [facility];  Service: Endoscopy;;  colon     SOCIAL HISTORY:  Social History   Socioeconomic History  . Marital status: Divorced    Spouse name: Not on file  . Number of children: 2  . Years of education: 51  . Highest education level: Not on file  Occupational History    Employer: [employer]  Social Needs  . Financial resource strain: Not hard at all  . Food insecurity    Worry: Never true    Inability: Never true  . Transportation needs    Medical: No    Non-medical: No  Tobacco Use  . Smoking status: Current Every Day Smoker    Packs/day: 0.75    Years: 30.00    Pack years: 22.50    Types: Cigarettes  . Smokeless tobacco: Never Used  Substance and Sexual Activity  . Alcohol use: No  . Drug use: No  . Sexual activity: Yes    Birth control/protection: Surgical  Lifestyle  . Physical activity    Days per week: 0 days    Minutes per session: 0 min  . Stress: Only a little  Relationships  . Social connections    Talks on phone: More than three times a week    Gets together: Three times a week    Attends religious service: 1 to 4 times per year    Active  member of club or organization: No    Attends meetings of clubs or organizations: Never    Relationship status: Widowed  . Intimate partner violence    Fear of current or ex partner: No    Emotionally abused: No    Physically abused: No    Forced sexual activity: No  Other Topics Concern  . Not on file  Social History Narrative  . Not on file    FAMILY HISTORY:  Family History  Problem Relation Age of Onset  . Diabetes Father   . Parkinson's disease Father   . Depression Father   . Heart disease Other   . Hypertension Mother   . Glaucoma Mother   . Basal cell carcinoma Mother   . Fibromyalgia Brother     CURRENT MEDICATIONS:  Outpatient Encounter Medications as of 07/16/2019  Medication Sig Note  . acetaminophen (TYLENOL) 500 MG tablet Take 500 mg by mouth  every 6 (six) hours as needed.   Marland Kitchen albuterol (VENTOLIN HFA) 108 (90 Base) MCG/ACT inhaler Inhale 1-2 puffs into the lungs every 6 (six) hours as needed for wheezing or shortness of breath.   . Cholecalciferol (VITAMIN D PO) Take 5,000 Units by mouth daily.    Marland Kitchen estradiol (ESTRACE) 1 MG tablet Take 1 mg by mouth daily. 05/30/2019: This medication is on hold  . fluticasone (FLOVENT HFA) 110 MCG/ACT inhaler Inhale 2 puffs into the lungs 2 (two) times daily.   Marland Kitchen ibuprofen (ADVIL,MOTRIN) 200 MG tablet Take 800 mg by mouth every 6 (six) hours as needed. For pain    . predniSONE (DELTASONE) 10 MG tablet TAKE 1 TABLET BY MOUTH EVERY DAY (Patient not taking: Reported on 04/16/2019)   . Prenatal MV & Min w/FA-DHA (PRENATAL ADULT GUMMY/DHA/FA PO) Take 2 each by mouth daily.   Marland Kitchen thyroid (ARMOUR) 30 MG tablet Take 30 mg by mouth daily before breakfast. 05/30/2019: This medication is on hold   No facility-administered encounter medications on file as of 07/16/2019.     ALLERGIES:  Allergies  Allergen Reactions  . Beef-Derived Products   . Codeine Other (See Comments)    Made patient sleep alot  . Morphine Nausea And Vomiting  . Promethazine Hcl Other (See Comments)    Hallucinations  . Alprazolam Anxiety and Other (See Comments)    Caused had tremors     PHYSICAL EXAM:  ECOG Performance status: 1  Vitals:   07/16/19 0820  BP: 137/86  Pulse: 92  Resp: 20  Temp: 98.8 F (37.1 C)  SpO2: 97%   There were no vitals filed for this visit.  Physical Exam Vitals signs reviewed.  Constitutional:      Appearance: Normal appearance.  Cardiovascular:     Rate and Rhythm: Normal rate and regular rhythm.     Heart sounds: Normal heart sounds.  Pulmonary:     Effort: Pulmonary effort is normal.     Breath sounds: Normal breath sounds.  Abdominal:     General: There is no distension.     Palpations: Abdomen is soft. There is no mass.  Musculoskeletal:        General: No swelling.  Skin:     General: Skin is warm.  Neurological:     General: No focal deficit present.     Mental Status: She is alert and oriented to person, place, and time.  Psychiatric:        Mood and Affect: Mood normal.        Behavior: Behavior  normal.      LABORATORY DATA:  I have reviewed the labs as listed.  CBC    Component Value Date/Time   WBC 13.9 (H) 07/01/2019 0745   RBC 4.54 07/01/2019 0745   HGB 14.1 07/01/2019 0745   HCT 42.5 07/01/2019 0745   PLT 536 (H) 07/01/2019 0745   MCV 93.6 07/01/2019 0745   MCH 31.1 07/01/2019 0745   MCHC 33.2 07/01/2019 0745   RDW 14.2 07/01/2019 0745   LYMPHSABS 3.6 07/01/2019 0745   MONOABS 1.3 (H) 07/01/2019 0745   EOSABS 0.4 07/01/2019 0745   BASOSABS 0.1 07/01/2019 0745   CMP Latest Ref Rng & Units 06/02/2019 04/29/2019 04/16/2019  Glucose 70 - 99 mg/dL 98 101(H) 136(H)  BUN 6 - 20 mg/dL _0 Creatinine 0.44 - 1.00 mg/dL 0.74 0.70 0.73  Sodium 135 - 145 mmol/L 138 137 138  Potassium 3.5 - 5.1 mmol/L 3.6 3.3(L) 3.3(L)  Chloride 98 - 111 mmol/L 105 103 104  CO2 22 - 32 mmol/L 21(L) 23 24  Calcium 8.9 - 10.3 mg/dL 8.9 9.1 8.9  Total Protein 6.5 - 8.1 g/dL 7.1 7.0 6.8  Total Bilirubin 0.3 - 1.2 mg/dL 0.3 0.3 0.4  Alkaline Phos 38 - 126 U/L 64 65 60  AST 15 - 41 U/L 17 13(L) 14(L)  ALT 0 - 44 U/L _1 DIAGNOSTIC IMAGING:  I have independently reviewed the scans and discussed with the patient.   I have reviewed Venita Lick LPN's note and agree with the documentation.  I personally performed a face-to-face visit, made revisions and my assessment and plan is as follows.    ASSESSMENT & PLAN:   Leukocytosis 1.  Leukocytosis and thrombocytosis: -He has consistently elevated leukocytosis and thrombocytosis since 2008. - Blood work was negative for Jak 2 V617F and BCR/ABL testing. - We reviewed bone marrow biopsy from 07/01/2019 which shows normocytic marrow with mild megakaryocytic hyperplasia.  No definitive evidence of myeloid  neoplasm. - Likely etiology for thrombocytosis and leukocytosis smoking.  However she is complaining of low-grade fevers in the evenings for the last few months.  She had some minor weight loss.  She also complains of pain all over her bones.  She denies any tick bites.  She denies any travel outside the country. - I have recommended evaluation by infectious disease at this point. -We will see her back in 3 to 4 months with repeat CBC.  2.  Left breast cancer: -T2N0 left breast cancer, status post lumpectomy on 05/30/2006, 2.3 cm, grade 3, ER/PR/HER-2 negative, status post 6 cycles of likely TAC chemotherapy.  She refused radiation therapy to the left breast. - Mammogram on 07/04/2019 was BI-RADS Category 0 with possible left breast mass.  She is scheduled for additional views on the 11th of this month.  3.  Smoking history: - She is a current active smoker, smokes about half to three fourths of a pack daily for 48 years. - CT angiogram of the chest on 04/16/2019 was negative for PE.  Upper lobe predominant diffuse groundglass micro-nodularity throughout both lungs consistent with smoking.  CT of the abdomen and pelvis on 05/09/2019 was negative for any acute intra-abdominal process.  Negative for lymphadenopathy.  Total time spent is 25 minutes with more than 50% of the time spent face-to-face discussing lab results, further work-up and coordination of care.    Orders placed this encounter:  Orders Placed This Encounter  Procedures  .  CBC with Differential/Platelet  . Comprehensive metabolic panel  . Lactate dehydrogenase      Derek Jack, MD [place] [phone]

## 2019-07-16 NOTE — Assessment & Plan Note (Signed)
1.  Leukocytosis and thrombocytosis: -He has consistently elevated leukocytosis and thrombocytosis since 2008. - Blood work was negative for Jak 2 V617F and BCR/ABL testing. - We reviewed bone marrow biopsy from 07/01/2019 which shows normocytic marrow with mild megakaryocytic hyperplasia.  No definitive evidence of myeloid neoplasm. - Likely etiology for thrombocytosis and leukocytosis smoking.  However she is complaining of low-grade fevers in the evenings for the last few months.  She had some minor weight loss.  She also complains of pain all over her bones.  She denies any tick bites.  She denies any travel outside the country. - I have recommended evaluation by infectious disease at this point. -We will see her back in 3 to 4 months with repeat CBC.  2.  Left breast cancer: -T2N0 left breast cancer, status post lumpectomy on 05/30/2006, 2.3 cm, grade 3, ER/PR/HER-2 negative, status post 6 cycles of likely TAC chemotherapy.  She refused radiation therapy to the left breast. - Mammogram on 07/04/2019 was BI-RADS Category 0 with possible left breast mass.  She is scheduled for additional views on the 11th of this month.  3.  Smoking history: - She is a current active smoker, smokes about half to three fourths of a pack daily for 48 years. - CT angiogram of the chest on 04/16/2019 was negative for PE.  Upper lobe predominant diffuse groundglass micro-nodularity throughout both lungs consistent with smoking.  CT of the abdomen and pelvis on 05/09/2019 was negative for any acute intra-abdominal process.  Negative for lymphadenopathy.

## 2019-07-21 ENCOUNTER — Ambulatory Visit (HOSPITAL_COMMUNITY): Payer: BC Managed Care – PPO | Admitting: Hematology

## 2019-07-22 ENCOUNTER — Ambulatory Visit (HOSPITAL_COMMUNITY)
Admission: RE | Admit: 2019-07-22 | Discharge: 2019-07-22 | Disposition: A | Discharge status: Home / Self Care | Payer: Self-pay | Source: Ambulatory Visit | Attending: Hematology | Admitting: Hematology

## 2019-07-22 ENCOUNTER — Other Ambulatory Visit: Payer: Self-pay

## 2019-07-22 DIAGNOSIS — R928 Other abnormal and inconclusive findings on diagnostic imaging of breast: Secondary | ICD-10-CM

## 2019-07-22 DIAGNOSIS — N6012 Diffuse cystic mastopathy of left breast: Secondary | ICD-10-CM | POA: Diagnosis not present

## 2019-07-28 ENCOUNTER — Telehealth: Payer: Self-pay

## 2019-07-28 NOTE — Telephone Encounter (Signed)
COVID-19 Pre-Screening Questions:07/28/2019  Do you currently have a fever (>100 F), chills or unexplained body aches? NO  Are you currently experiencing new cough, shortness of breath, sore throat, runny nose? NO  .  Have you recently travelled outside the state of [place] in the last 14 days? NO  .  Have you been in contact with someone that is currently pending confirmation of Covid19 testing or has been confirmed to have the [place] virus?  NO  **If the patient answers NO to ALL questions -  advise the patient to please call the clinic before coming to the office should any symptoms develop.

## 2019-07-29 ENCOUNTER — Telehealth: Payer: Self-pay | Admitting: Pharmacy Technician

## 2019-07-29 ENCOUNTER — Ambulatory Visit (INDEPENDENT_AMBULATORY_CARE_PROVIDER_SITE_OTHER): Payer: Self-pay | Admitting: Internal Medicine

## 2019-07-29 ENCOUNTER — Encounter: Payer: Self-pay | Admitting: Internal Medicine

## 2019-07-29 ENCOUNTER — Other Ambulatory Visit: Payer: Self-pay

## 2019-07-29 DIAGNOSIS — R509 Fever, unspecified: Secondary | ICD-10-CM

## 2019-07-29 NOTE — Progress Notes (Signed)
[place] for Infectious Disease      Reason for Consult: temperatures of 98-100 degrees    Referring Physician: Dr. Delton Coombes    Patient ID: Becky Gutierrez, female    DOB: [birth year], 58 y.o.   MRN: [id number]  HPI:   She is here for above.  She has noted temperatures in the range of 98-100 for several months.  She also has had multiple unexplained symptoms of malaise and fatigue for that time associated with feelings of being hot similar to diaphoresis rather than hot flashes.  No weight loss.  She does have loose stools and watery diarrhea associated with bloating.  She has undergone extensive work up by hematology, pulmonary, GI including endocscopy, CTof chest and abdomen, labs and no etiology found.  She is here 'wanting answers".  The only notable finding is findings of bronchiolitis on CT of the chest that seem to be related to smoking vs potential ILD.  She feels pain in her liver area and spleen area but no issues on work up.  Had a recent bone marrow without concerns.  No joint swelling, no rashes.  Cervical lad has resolved.  Has had persistent leukocytosis known for over 10years.  Previous record reviewed and partially summarized above.   Past Medical History:  Diagnosis Date  . Bipolar 1 disorder ([place])   . Bipolar 1 disorder ([place])   . Cancer [facility]     left breast cancer  . COPD (chronic obstructive pulmonary disease) ([place])   . Dyspnea    with exersion  . PONV (postoperative nausea and vomiting)    with morphine given  . Thyroid disease    Low T3    Prior to Admission medications   Medication Sig Start Date End Date Taking? Authorizing Provider  acetaminophen (TYLENOL) 500 MG tablet Take 500 mg by mouth every 6 (six) hours as needed.   Yes [provider]  albuterol (VENTOLIN HFA) 108 (90 Base) MCG/ACT inhaler Inhale 1-2 puffs into the lungs every 6 (six) hours as needed for wheezing or shortness of breath. 04/16/19  Yes Fransico Meadow, PA-C   Cholecalciferol (VITAMIN D PO) Take 5,000 Units by mouth daily.    Yes [provider]  estradiol (ESTRACE) 1 MG tablet Take 1 mg by mouth daily.   Yes [provider]  fluticasone (FLOVENT HFA) 110 MCG/ACT inhaler Inhale 2 puffs into the lungs 2 (two) times daily. 04/16/19 04/15/20 Yes Caryl Ada K, PA-C  ibuprofen (ADVIL,MOTRIN) 200 MG tablet Take 800 mg by mouth every 6 (six) hours as needed. For pain    Yes [provider]  predniSONE (DELTASONE) 10 MG tablet TAKE 1 TABLET BY MOUTH EVERY DAY 02/24/19  Yes Carole Civil, MD  Prenatal MV & Min w/FA-DHA (PRENATAL ADULT GUMMY/DHA/FA PO) Take 2 each by mouth daily.   Yes [provider]  thyroid (ARMOUR) 30 MG tablet Take 30 mg by mouth daily before breakfast.   Yes [provider]    Allergies  Allergen Reactions  . Beef-Derived Products   . Codeine Other (See Comments)    Made patient sleep alot  . Morphine Nausea And Vomiting  . Promethazine Hcl Other (See Comments)    Hallucinations  . Alprazolam Anxiety and Other (See Comments)    Caused had tremors    Social History   Tobacco Use  . Smoking status: Current Every Day Smoker    Packs/day: 0.75    Years: 30.00    Pack  years: 22.50    Types: Cigarettes  . Smokeless tobacco: Never Used  Substance Use Topics  . Alcohol use: No  . Drug use: No    Family History  Problem Relation Age of Onset  . Diabetes Father   . Parkinson's disease Father   . Depression Father   . Heart disease Other   . Hypertension Mother   . Glaucoma Mother   . Basal cell carcinoma Mother   . Fibromyalgia Brother     Review of Systems  Constitutional: positive for sweats, fatigue and malaise or negative for chills, anorexia and weight loss Respiratory: positive for sputum, negative for hemoptysis, wheezing, dyspnea on exertion or chronic bronchitis Cardiovascular: negative for chest pressure/discomfort Gastrointestinal: positive for diarrhea,  negative for nausea Genitourinary: negative for frequency and dysuria Integument/breast: negative for rash Hematologic/lymphatic: negative for lymphadenopathy Musculoskeletal: positive for bone pain, negative for myalgias and arthralgias Neurological: negative for headaches, dizziness and gait problems All other systems reviewed and are negative    Constitutional: in no apparent distress  Vitals:   07/29/19 0902  BP: 122/84  Pulse: 80  Temp: 98.5 F (36.9 C)   EYES: anicteric ENMT: no cervical lad Cardiovascular: Cor RRR Respiratory: CTA B; normal respiratory effort GI: Bowel sounds are normal, liver is not enlarged, spleen is not enlarged; some tenderness on palpation of left side Musculoskeletal: pedal edema minimal, no joint swelling, no joint warmth in ankles, knees, elbows, hands, wrists Skin: negatives: no rash Hematologic: no cervical lad  Labs: Lab Results  Component Value Date   WBC 13.9 (H) 07/01/2019   HGB 14.1 07/01/2019   HCT 42.5 07/01/2019   MCV 93.6 07/01/2019   PLT 536 (H) 07/01/2019    Lab Results  Component Value Date   CREATININE 0.74 06/02/2019   BUN 8 06/02/2019   NA 138 06/02/2019   K 3.6 06/02/2019   CL 105 06/02/2019   CO2 21 (L) 06/02/2019    Lab Results  Component Value Date   ALT 13 06/02/2019   AST 17 06/02/2019   ALKPHOS 64 06/02/2019   BILITOT 0.3 06/02/2019   INR 0.9 07/01/2019     Assessment: temperature fluctuations.  I discussed the constellation of symptoms and no particular unifying diagnosis.  Certainly her lung findings warrant consideration as cause of fatigue, chronic inflammation and the only real treatment would be smoking cessation.  Chronic infections would include HIV and hepatitis C and I will check for those. At this point, this is not c/w a bacterial infection so antibiotics not indicated.  I suspect she would improve with smoking cessation based on lung CT findings.  It is possible that steroids would help but  without significant respiratory symptoms, I don't feel compelled to give at this time.  I think the main thing is to check the above labs, focus on smoking and if new symptoms develop could reassess.    Plan: 1) HIV, hepatitis C 2) smoking cessation (previously had success with Welbutrin and will discuss with Dr. Anastasio Champion).  Follow up here with any new symptoms, concerns, otherwise PRN

## 2019-07-29 NOTE — Telephone Encounter (Signed)
[facility] Patient Advocate Encounter ° °Insurance verification completed.   ° °The patient is uninsured and will need patient assistance for medication. ° °We can complete the application and will need to meet with the patient for signatures and income documentation. ° °Lynnlee Revels E. Corrin Sieling, CPhT °Specialty Pharmacy Patient Advocate °[facility] °Phone: [phone] °Fax:  [phone] ° ° °

## 2019-07-30 LAB — HEPATITIS C ANTIBODY
Hepatitis C Ab: NONREACTIVE
SIGNAL TO CUT-OFF: 0.01 (ref <1.00)

## 2019-07-30 LAB — HIV ANTIBODY (ROUTINE TESTING W REFLEX): HIV 1&2 Ab, 4th Generation: NONREACTIVE

## 2019-08-04 ENCOUNTER — Telehealth: Payer: Self-pay | Admitting: *Deleted

## 2019-08-04 NOTE — Telephone Encounter (Signed)
-----   Message from Thayer Headings, MD sent at 07/30/2019  1:18 PM EDT ----- Please let her know her HIV and hepatitis C are both negative.  thanks

## 2019-08-04 NOTE — Telephone Encounter (Signed)
Relayed results to patient. She had no further questions. Landis Gandy, RN

## 2019-10-16 ENCOUNTER — Inpatient Hospital Stay (HOSPITAL_COMMUNITY): Payer: Self-pay | Attending: Hematology

## 2019-10-23 ENCOUNTER — Ambulatory Visit (HOSPITAL_COMMUNITY): Payer: Self-pay | Admitting: Nurse Practitioner

## 2019-10-30 ENCOUNTER — Ambulatory Visit (INDEPENDENT_AMBULATORY_CARE_PROVIDER_SITE_OTHER): Payer: Self-pay | Admitting: Internal Medicine

## 2019-10-30 ENCOUNTER — Other Ambulatory Visit: Payer: Self-pay

## 2019-10-30 ENCOUNTER — Encounter (INDEPENDENT_AMBULATORY_CARE_PROVIDER_SITE_OTHER): Payer: Self-pay | Admitting: Internal Medicine

## 2019-10-30 VITALS — BP 122/78 | HR 87 | Temp 97.4°F | Resp 18 | Ht 69.0 in | Wt 208.0 lb

## 2019-10-30 DIAGNOSIS — R509 Fever, unspecified: Secondary | ICD-10-CM

## 2019-10-30 DIAGNOSIS — E559 Vitamin D deficiency, unspecified: Secondary | ICD-10-CM

## 2019-10-30 DIAGNOSIS — R5383 Other fatigue: Secondary | ICD-10-CM

## 2019-10-30 DIAGNOSIS — R5381 Other malaise: Secondary | ICD-10-CM

## 2019-10-30 LAB — CBC WITH DIFFERENTIAL/PLATELET
Absolute Monocytes: 1240 cells/uL — ABNORMAL HIGH (ref 200–950)
Basophils Absolute: 129 cells/uL (ref 0–200)
Basophils Relative: 0.8 %
Eosinophils Absolute: 258 cells/uL (ref 15–500)
Eosinophils Relative: 1.6 %
HCT: 38.1 % (ref 35.0–45.0)
Hemoglobin: 13.1 g/dL (ref 11.7–15.5)
Lymphs Abs: 3639 cells/uL (ref 850–3900)
MCH: 30.8 pg (ref 27.0–33.0)
MCHC: 34.4 g/dL (ref 32.0–36.0)
MCV: 89.6 fL (ref 80.0–100.0)
MPV: 9.9 fL (ref 7.5–12.5)
Monocytes Relative: 7.7 %
Neutro Abs: 10835 cells/uL — ABNORMAL HIGH (ref 1500–7800)
Neutrophils Relative %: 67.3 %
Platelets: 586 10*3/uL — ABNORMAL HIGH (ref 140–400)
RBC: 4.25 10*6/uL (ref 3.80–5.10)
RDW: 13.5 % (ref 11.0–15.0)
Total Lymphocyte: 22.6 %
WBC: 16.1 10*3/uL — ABNORMAL HIGH (ref 3.8–10.8)

## 2019-10-30 NOTE — Progress Notes (Signed)
Metrics: Intervention Frequency ACO  Documented Smoking Status Yearly  Screened one or more times in 24 months  Cessation Counseling or  Active cessation medication Past 24 months  Past 24 months   Guideline developer: UpToDate (See UpToDate for funding source) Date Released: 2014       Wellness Office Visit  Subjective:  Patient ID: Becky Gutierrez, female    DOB: [birth year]  Age: 58 y.o. MRN: [id number]  CC: This lady comes in after a long hiatus for investigation of fever. HPI She has been seen by pulmonology, cardiology and infectious disease.  She had symptoms of malaise and fatigue with elevated white blood cell count, dyspnea, low-grade fever.  She has undergone extensive testing including CT chest, CT abdomen and various blood work.  No etiology has been found. The last visit she had was in August of this year with infectious disease and she had further testing with HIV test and hepatitis C, which were both negative. In the last month, she has taken her life into her own hands and is taking some sort of oil that she swears has really helped her change her life around.  She is now working out on a regular basis and feels great.  She no longer has any kind of fevers. She feels well enough now to go back to work.  Past Medical History:  Diagnosis Date  . Bipolar 1 disorder ([place])   . Bipolar 1 disorder ([place])   . Cancer [facility])     left breast cancer  . COPD (chronic obstructive pulmonary disease) ([place])   . Dyspnea    with exersion  . PONV (postoperative nausea and vomiting)    with morphine given  . Thyroid disease    Low T3      Family History  Problem Relation Age of Onset  . Diabetes Father   . Parkinson's disease Father   . Depression Father   . Heart disease Other   . Hypertension Mother   . Glaucoma Mother   . Basal cell carcinoma Mother   . 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   Social History   Tobacco Use  . Smoking status: Current Every Day Smoker    Packs/day: 0.75    Years: 30.00    Pack years: 22.50    Types: Cigarettes  . Smokeless tobacco: Never Used  Substance Use Topics  . Alcohol use: No    Current Meds  Medication Sig  . Cholecalciferol (VITAMIN D-3) 125 MCG (5000 UT) TABS Take 2 tablets by mouth daily.  . Multiple Vitamin (MULTIVITAMIN) tablet Take 2 tablets by mouth daily.      Objective:   Today's Vitals: BP 122/78 (BP Location: Right Arm, Patient Position: Sitting, Cuff Size: Normal)   Pulse 87   Temp (!) 97.4 F (36.3 C) (Temporal)   Resp 18   Ht 5\' 9"  (1.753 m)   Wt 208 lb (94.3 kg)   SpO2 96% Comment: wearing a mask.  BMI 30.72 kg/m  Vitals with BMI 10/30/2019 07/29/2019 07/16/2019  Height 5\' 9"  - -  Weight 208 lbs - -  BMI 99991111 - -  Systolic 123XX123 [id number] 0000000  Diastolic 78 84 86  Pulse 87 80 92     Physical Exam   She is in an extremely cheerful mood, but on the  borderline of mania.  Vital signs are very stable and she does not have even a low-grade fever now.    Assessment   1. Vitamin D deficiency disease   2. Malaise and fatigue   3. FUO (fever of unknown origin)       Tests ordered Orders Placed This Encounter  Procedures  . CBC with Differential/Platelets     Plan: 1. We will repeat a CBC with differential now. 2. I will fill out paperwork to release her so that she can go back to work. 3. Follow-up in about 6 months for an annual physical exam.   No orders of the defined types were placed in this encounter.   Doree Albee, MD

## 2019-11-02 ENCOUNTER — Encounter (INDEPENDENT_AMBULATORY_CARE_PROVIDER_SITE_OTHER): Payer: Self-pay | Admitting: Internal Medicine

## 2019-12-01 ENCOUNTER — Telehealth (INDEPENDENT_AMBULATORY_CARE_PROVIDER_SITE_OTHER): Payer: Self-pay

## 2019-12-01 ENCOUNTER — Other Ambulatory Visit (INDEPENDENT_AMBULATORY_CARE_PROVIDER_SITE_OTHER): Payer: Self-pay

## 2019-12-01 DIAGNOSIS — R5381 Other malaise: Secondary | ICD-10-CM

## 2019-12-01 DIAGNOSIS — R5383 Other fatigue: Secondary | ICD-10-CM

## 2019-12-01 NOTE — Telephone Encounter (Signed)
She prefers a Hematologist is because she is feels like when she talk to her oncologist he did not know why either. So just wanting someone with a specialty in the problems sh e can find out why?

## 2019-12-01 NOTE — Telephone Encounter (Signed)
Pt called and asked for her resutls; Also stated she would like t you to refer her to hemotology also. To see what her problem could be?

## 2019-12-01 NOTE — Telephone Encounter (Signed)
Please call about lab work.  Pt upset with me, I was trying to check in a patients, answer her phone and answer a call from another office.  I told her I was sorry.

## 2019-12-01 NOTE — Progress Notes (Signed)
Pt called and asked for a hematologist for this visit to go over her blood work. She said Cancer MD didn't have an idea on what is causing abnormal lab work. Pt preference to see someone who was a Hematologist.

## 2019-12-01 NOTE — Telephone Encounter (Signed)
Please let her know about her blood results from November which still shows elevated white blood cell count.  She has already seen a hematologist (this was the oncologist who addressed her increased white blood cell count previously at [facility].).  I do not know who else she wants to go and see.  Let me know.

## 2019-12-02 ENCOUNTER — Other Ambulatory Visit: Payer: Self-pay

## 2019-12-02 ENCOUNTER — Inpatient Hospital Stay (HOSPITAL_COMMUNITY): Payer: Self-pay | Attending: Nurse Practitioner | Admitting: Nurse Practitioner

## 2019-12-02 ENCOUNTER — Inpatient Hospital Stay (HOSPITAL_COMMUNITY): Payer: Self-pay

## 2019-12-02 DIAGNOSIS — Z833 Family history of diabetes mellitus: Secondary | ICD-10-CM | POA: Insufficient documentation

## 2019-12-02 DIAGNOSIS — F17210 Nicotine dependence, cigarettes, uncomplicated: Secondary | ICD-10-CM | POA: Insufficient documentation

## 2019-12-02 DIAGNOSIS — M7989 Other specified soft tissue disorders: Secondary | ICD-10-CM | POA: Insufficient documentation

## 2019-12-02 DIAGNOSIS — D72829 Elevated white blood cell count, unspecified: Secondary | ICD-10-CM | POA: Insufficient documentation

## 2019-12-02 DIAGNOSIS — D72828 Other elevated white blood cell count: Secondary | ICD-10-CM

## 2019-12-02 DIAGNOSIS — Z8249 Family history of ischemic heart disease and other diseases of the circulatory system: Secondary | ICD-10-CM | POA: Insufficient documentation

## 2019-12-02 DIAGNOSIS — J449 Chronic obstructive pulmonary disease, unspecified: Secondary | ICD-10-CM | POA: Insufficient documentation

## 2019-12-02 DIAGNOSIS — E079 Disorder of thyroid, unspecified: Secondary | ICD-10-CM | POA: Insufficient documentation

## 2019-12-02 DIAGNOSIS — F319 Bipolar disorder, unspecified: Secondary | ICD-10-CM | POA: Insufficient documentation

## 2019-12-02 DIAGNOSIS — R519 Headache, unspecified: Secondary | ICD-10-CM | POA: Insufficient documentation

## 2019-12-02 DIAGNOSIS — R7989 Other specified abnormal findings of blood chemistry: Secondary | ICD-10-CM | POA: Insufficient documentation

## 2019-12-02 DIAGNOSIS — Z79899 Other long term (current) drug therapy: Secondary | ICD-10-CM | POA: Insufficient documentation

## 2019-12-02 DIAGNOSIS — Z853 Personal history of malignant neoplasm of breast: Secondary | ICD-10-CM | POA: Insufficient documentation

## 2019-12-02 DIAGNOSIS — R634 Abnormal weight loss: Secondary | ICD-10-CM | POA: Insufficient documentation

## 2019-12-02 DIAGNOSIS — R42 Dizziness and giddiness: Secondary | ICD-10-CM | POA: Insufficient documentation

## 2019-12-02 LAB — CBC WITH DIFFERENTIAL/PLATELET
Abs Immature Granulocytes: 0.06 10*3/uL (ref 0.00–0.07)
Basophils Absolute: 0.1 10*3/uL (ref 0.0–0.1)
Basophils Relative: 1 %
Eosinophils Absolute: 0.3 10*3/uL (ref 0.0–0.5)
Eosinophils Relative: 2 %
HCT: 42.1 % (ref 36.0–46.0)
Hemoglobin: 13.9 g/dL (ref 12.0–15.0)
Immature Granulocytes: 0 %
Lymphocytes Relative: 21 %
Lymphs Abs: 3.5 10*3/uL (ref 0.7–4.0)
MCH: 30.8 pg (ref 26.0–34.0)
MCHC: 33 g/dL (ref 30.0–36.0)
MCV: 93.1 fL (ref 80.0–100.0)
Monocytes Absolute: 1.3 10*3/uL — ABNORMAL HIGH (ref 0.1–1.0)
Monocytes Relative: 8 %
Neutro Abs: 11.5 10*3/uL — ABNORMAL HIGH (ref 1.7–7.7)
Neutrophils Relative %: 68 %
Platelets: 550 10*3/uL — ABNORMAL HIGH (ref 150–400)
RBC: 4.52 MIL/uL (ref 3.87–5.11)
RDW: 14.6 % (ref 11.5–15.5)
WBC: 16.7 10*3/uL — ABNORMAL HIGH (ref 4.0–10.5)
nRBC: 0 % (ref 0.0–0.2)

## 2019-12-02 LAB — COMPREHENSIVE METABOLIC PANEL
ALT: 20 U/L (ref 0–44)
AST: 19 U/L (ref 15–41)
Albumin: 3.9 g/dL (ref 3.5–5.0)
Alkaline Phosphatase: 60 U/L (ref 38–126)
Anion gap: 12 (ref 5–15)
BUN: 10 mg/dL (ref 6–20)
CO2: 23 mmol/L (ref 22–32)
Calcium: 8.8 mg/dL — ABNORMAL LOW (ref 8.9–10.3)
Chloride: 104 mmol/L (ref 98–111)
Creatinine, Ser: 0.67 mg/dL (ref 0.44–1.00)
GFR calc Af Amer: >60 mL/min (ref >60)
GFR calc non Af Amer: >60 mL/min (ref >60)
Glucose, Bld: 106 mg/dL — ABNORMAL HIGH (ref 70–99)
Potassium: 4 mmol/L (ref 3.5–5.1)
Sodium: 139 mmol/L (ref 135–145)
Total Bilirubin: 0.3 mg/dL (ref 0.3–1.2)
Total Protein: 6.8 g/dL (ref 6.5–8.1)

## 2019-12-02 LAB — LACTATE DEHYDROGENASE: LDH: 141 U/L (ref 98–192)

## 2019-12-02 NOTE — Patient Instructions (Signed)
[facility] at [facility] Discharge Instructions     Thank you for choosing  Cancer Center at [facility] to provide your oncology and hematology care.  To afford each patient quality time with our provider, please arrive at least 15 minutes before your scheduled appointment time.   If you have a lab appointment with the Cancer Center please come in thru the Main Entrance and check in at the main information desk.  You need to re-schedule your appointment should you arrive 10 or more minutes late.  We strive to give you quality time with our providers, and arriving late affects you and other patients whose appointments are after yours.  Also, if you no show three or more times for appointments you may be dismissed from the clinic at the providers discretion.     Again, thank you for choosing [facility].  Our hope is that these requests will decrease the amount of time that you wait before being seen by our physicians.       _____________________________________________________________  Should you have questions after your visit to [facility], please contact our office at [phone] between the hours of 8:00 a.m. and 4:30 p.m.  Voicemails left after 4:00 p.m. will not be returned until the following business day.  For prescription refill requests, have your pharmacy contact our office and allow 72 hours.    Due to Covid, you will need to wear a mask upon entering the hospital. If you do not have a mask, a mask will be given to you at the Main Entrance upon arrival. For doctor visits, patients may have 1 support person with them. For treatment visits, patients can not have anyone with them due to social distancing guidelines and our immunocompromised population.      

## 2019-12-02 NOTE — Assessment & Plan Note (Addendum)
1.  Leukocytosis and thrombocytosis: -He has consistently elevated leukocytosis and thrombocytosis since 2008. -Blood work was negative for Jak 2 V617F and BCR/ABL testing. -Bone marrow biopsy from 07/01/2019 which showed normocytic marrow with mild megakaryocyte hyperplasia.  No definitive evidence of myeloid neoplasm. -Likely etiology for thrombocytosis and leukocytosis is smoking. -She has also complained of low-grade fevers in the evenings for the last few months.  She also had some minor weight loss.  She also complains of pain over her bones. -She denies any tick bites.  She denies any travel outside the country. -She was referred to be evaluated by infectious disease. -Labs done on 12/02/2019 showed WBC 16.7, hemoglobin 13.9, platelets 550 -Patient reports she stopped smoking in October however she started taking a detox supplement called Xeolite. -We will see her back in 4 months with repeat labs.  2.  Left breast cancer: -T2N0 left breast cancer, status post lumpectomy on 05/30/2006, 2.3 cm, grade 3, ER/PR/HER-2 negative, Status post 6 cycles of likely TAC chemotherapy.  She refused radiation therapy to the left breast. -Mammogram on 07/04/2019 was BI-RADS Category 0 with possible left breast mass. -Patient had a repeat mammogram on 07/22/2019 which was BI-RADS Category 2 benign. -She will repeat her mammogram next August.  3.  Smoking history: -She is a current active smoker.  She smokes about half to three fourths of a pack daily for 48 years. -CT angiogram of the chest on 04/16/2019 was negative for PE.  Upper lobe predominant bleed diffuse ground glass micronodularity throughout both lungs consistent with smoking. -CT of the abdomen pelvis on 05/09/2019 was negative for any acute intra-abdominal process.  Negative for lymphadenopathy.

## 2019-12-02 NOTE — Progress Notes (Signed)
[address] [place], [place] [address]   CLINIC:  Medical Oncology/Hematology  PCP:  Doree Albee, MD [place] [address] [phone]   REASON FOR VISIT: Follow-up for leukocytosis and thrombocytosis  CURRENT THERAPY: Observation   INTERVAL HISTORY:  Becky Gutierrez 58 y.o. female returns for routine follow-up for leukocytosis and thrombocytosis.  Patient reports she has been doing well since her last visit.  She does have occasional dizziness and headaches.  She denies any bright red bleeding per rectum or melena.  She denies any rash.  She denies any lymphadenopathy. Denies any nausea, vomiting, or diarrhea. Denies any new pains. Had not noticed any recent bleeding such as epistaxis, hematuria or hematochezia. Denies recent chest pain on exertion, shortness of breath on minimal exertion, pre-syncopal episodes, or palpitations. Denies any numbness or tingling in hands or feet. Denies any recent fevers, infections, or recent hospitalizations. Patient reports appetite at 75% and energy level at 100%.  She is eating well maintain her weight at this time.     REVIEW OF SYSTEMS:  Review of Systems  Cardiovascular: Positive for leg swelling.  Neurological: Positive for dizziness and headaches.  All other systems reviewed and are negative.    PAST MEDICAL/SURGICAL HISTORY:  Past Medical History:  Diagnosis Date  . Bipolar 1 disorder ([place])   . Bipolar 1 disorder ([place])   . Cancer [facility])     left breast cancer  . COPD (chronic obstructive pulmonary disease) ([place])   . Dyspnea    with exersion  . PONV (postoperative nausea and vomiting)    with morphine given  . Thyroid disease    Low T3   Past Surgical History:  Procedure Laterality Date  . ABDOMINAL HYSTERECTOMY    . APPENDECTOMY    . BREAST LUMPECTOMY     left side  . CESAREAN SECTION    . CHOLECYSTECTOMY    . COLONOSCOPY N/A 10/21/2018   Procedure: COLONOSCOPY;  Surgeon:  Rogene Houston, MD;  Location: [facility];  Service: Endoscopy;  Laterality: N/A;  12:00-rescheduled 11/11 @ 7:30am per Lelon Frohlich  . ESOPHAGOGASTRODUODENOSCOPY N/A 10/21/2018   Procedure: ESOPHAGOGASTRODUODENOSCOPY (EGD);  Surgeon: Rogene Houston, MD;  Location: [facility];  Service: Endoscopy;  Laterality: N/A;  . HEMORROIDECTOMY    . POLYPECTOMY  10/21/2018   Procedure: POLYPECTOMY;  Surgeon: Rogene Houston, MD;  Location: [facility];  Service: Endoscopy;;  colon     SOCIAL HISTORY:  Social History   Socioeconomic History  . Marital status: Divorced    Spouse name: Not on file  . Number of children: 2  . Years of education: 35  . Highest education level: Not on file  Occupational History    Employer: [employer]  Tobacco Use  . Smoking status: Current Every Day Smoker    Packs/day: 0.75    Years: 30.00    Pack years: 22.50    Types: Cigarettes  . Smokeless tobacco: Never Used  Substance and Sexual Activity  . Alcohol use: No  . Drug use: No  . Sexual activity: Yes    Birth control/protection: Surgical  Other Topics Concern  . 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   Social Determinants of Health   Financial Resource Strain: Low Risk   . Difficulty of Paying Living Expenses: Not hard at all  Food Insecurity: No Food Insecurity  . Worried About Charity fundraiser in the Last Year: Never true  . Ran Out of Food in the Last Year: Never true  Transportation Needs: No Transportation Needs  . Lack of Transportation (Medical): No  . Lack of Transportation (Non-Medical): No  Physical Activity: Inactive  . Days of Exercise per Week: 0 days  . Minutes of Exercise per Session: 0 min  Stress: No Stress Concern Present  . Feeling of Stress : Only a little  Social Connections: Somewhat Isolated  . Frequency of Communication with Friends and Family: More than  three times a week  . Frequency of Social Gatherings with Friends and Family: Three times a week  . Attends Religious Services: 1 to 4 times per year  . Active Member of Clubs or Organizations: No  . Attends Archivist Meetings: Never  . Marital Status: Widowed  Intimate Partner Violence: Not At Risk  . Fear of Current or Ex-Partner: No  . Emotionally Abused: No  . Physically Abused: No  . Sexually Abused: No    FAMILY HISTORY:  Family History  Problem Relation Age of Onset  . Diabetes Father   . Parkinson's disease Father   . Depression Father   . Heart disease Other   . Hypertension Mother   . Glaucoma Mother   . Basal cell carcinoma Mother   . Fibromyalgia Brother     CURRENT MEDICATIONS:  Outpatient Encounter Medications as of 12/02/2019  Medication Sig  . Cholecalciferol (VITAMIN D-3) 125 MCG (5000 UT) TABS Take 2 tablets by mouth daily.  . Multiple Vitamin (MULTIVITAMIN) tablet Take 2 tablets by mouth daily.   No facility-administered encounter medications on file as of 12/02/2019.    ALLERGIES:  Allergies  Allergen Reactions  . Beef-Derived Products   . Codeine Other (See Comments)    Made patient sleep alot  . Morphine Nausea And Vomiting  . Promethazine Hcl Other (See Comments)    Hallucinations  . Alprazolam Anxiety and Other (See Comments)    Caused had tremors     PHYSICAL EXAM:  ECOG Performance status: 1  Vitals:   12/02/19 0945  BP: 134/89  Pulse: 89  Resp: 18  Temp: (!) 97.1 F (36.2 C)  SpO2: 99%   Filed Weights   12/02/19 0945  Weight: 214 lb (97.1 kg)    Physical Exam Constitutional:      Appearance: Normal appearance. She is normal weight.  Cardiovascular:     Rate and Rhythm: Normal rate and regular rhythm.     Heart sounds: Normal heart sounds.  Pulmonary:     Effort: Pulmonary effort is normal.     Breath sounds: Normal breath sounds.  Abdominal:     General: Bowel sounds are normal.     Palpations: Abdomen  is soft.  Musculoskeletal:        General: Normal range of motion.  Skin:    General: Skin is warm.  Neurological:     Mental Status: She is alert and oriented to person, place, and time. Mental status is at baseline.  Psychiatric:        Mood and Affect: Mood normal.        Behavior: Behavior normal.        Thought Content: Thought content normal.        Judgment: Judgment normal.      LABORATORY DATA:  I have reviewed the labs as listed.  CBC    Component  Value Date/Time   WBC 16.7 (H) 12/02/2019 0837   RBC 4.52 12/02/2019 0837   HGB 13.9 12/02/2019 0837   HCT 42.1 12/02/2019 0837   PLT 550 (H) 12/02/2019 0837   MCV 93.1 12/02/2019 0837   MCH 30.8 12/02/2019 0837   MCHC 33.0 12/02/2019 0837   RDW 14.6 12/02/2019 0837   LYMPHSABS 3.5 12/02/2019 0837   MONOABS 1.3 (H) 12/02/2019 0837   EOSABS 0.3 12/02/2019 0837   BASOSABS 0.1 12/02/2019 0837   CMP Latest Ref Rng & Units 12/02/2019 06/02/2019 04/29/2019  Glucose 70 - 99 mg/dL 106(H) 98 101(H)  BUN 6 - 20 mg/dL '10 8 12  '$ Creatinine 0.44 - 1.00 mg/dL 0.67 0.74 0.70  Sodium 135 - 145 mmol/L 139 138 137  Potassium 3.5 - 5.1 mmol/L 4.0 3.6 3.3(L)  Chloride 98 - 111 mmol/L 104 105 103  CO2 22 - 32 mmol/L 23 21(L) 23  Calcium 8.9 - 10.3 mg/dL 8.8(L) 8.9 9.1  Total Protein 6.5 - 8.1 g/dL 6.8 7.1 7.0  Total Bilirubin 0.3 - 1.2 mg/dL 0.3 0.3 0.3  Alkaline Phos 38 - 126 U/L 60 64 65  AST 15 - 41 U/L 19 17 13(L)  ALT 0 - 44 U/L '20 13 12     '$ DIAGNOSTIC IMAGING:  I have independently reviewed the mammogram scans and discussed with the patient.  I personally performed a face-to-face visit.  All questions were answered to patient's stated satisfaction. Encouraged patient to call with any new concerns or questions before his next visit to the cancer center and we can certain see him sooner, if needed.     ASSESSMENT & PLAN:   Leukocytosis 1.  Leukocytosis and thrombocytosis: -He has consistently elevated leukocytosis and  thrombocytosis since 2008. -Blood work was negative for Jak 2 V617F and BCR/ABL testing. -Bone marrow biopsy from 07/01/2019 which showed normocytic marrow with mild megakaryocyte hyperplasia.  No definitive evidence of myeloid neoplasm. -Likely etiology for thrombocytosis and leukocytosis is smoking. -She has also complained of low-grade fevers in the evenings for the last few months.  She also had some minor weight loss.  She also complains of pain over her bones. -She denies any tick bites.  She denies any travel outside the country. -She was referred to be evaluated by infectious disease. -Labs done on 12/02/2019 showed WBC 16.7, hemoglobin 13.9, platelets 550 -Patient reports she stopped smoking in October however she started taking a detox supplement called Xeolite. -We will see her back in 4 months with repeat labs.  2.  Left breast cancer: -T2N0 left breast cancer, status post lumpectomy on 05/30/2006, 2.3 cm, grade 3, ER/PR/HER-2 negative, Status post 6 cycles of likely TAC chemotherapy.  She refused radiation therapy to the left breast. -Mammogram on 07/04/2019 was BI-RADS Category 0 with possible left breast mass. -Patient had a repeat mammogram on 07/22/2019 which was BI-RADS Category 2 benign. -She will repeat her mammogram next August.  3.  Smoking history: -She is a current active smoker.  She smokes about half to three fourths of a pack daily for 48 years. -CT angiogram of the chest on 04/16/2019 was negative for PE.  Upper lobe predominant bleed diffuse ground glass micronodularity throughout both lungs consistent with smoking. -CT of the abdomen pelvis on 05/09/2019 was negative for any acute intra-abdominal process.  Negative for lymphadenopathy.      Orders placed this encounter:  Orders Placed This Encounter  Procedures  . Lactate dehydrogenase  . CBC with Differential/Platelet  . Comprehensive  metabolic panel  . Vitamin B12  . Vitamin D 25 hydroxy      Francene Finders, FNP-C [facility] [phone]

## 2020-03-29 IMAGING — CT CT ANGIOGRAPHY CHEST
2 of 6 series · 18 of 46 positions shown · IV contrast (omnipaque)
Comparison: Chest x-ray dated March 25, 2019.

CLINICAL DATA: Persistent fever and shortness of breath for the
past month despite 3 rounds of antibiotics. History of COPD and
breast cancer.

EXAM:
CT ANGIOGRAPHY CHEST WITH CONTRAST
TECHNIQUE: Multidetector CT imaging of the chest was performed using the
standard protocol during bolus administration of intravenous
contrast. Multiplanar CT image reconstructions and MIPs were
obtained to evaluate the vascular anatomy.
CONTRAST:  100mL OMNIPAQUE IOHEXOL 350 MG/ML SOLN

[Series 5: thins · axial · 0.61mm/px · z∈[-301,-19]mm · 15 of 310 slices shown]
[im 14/310  lung]
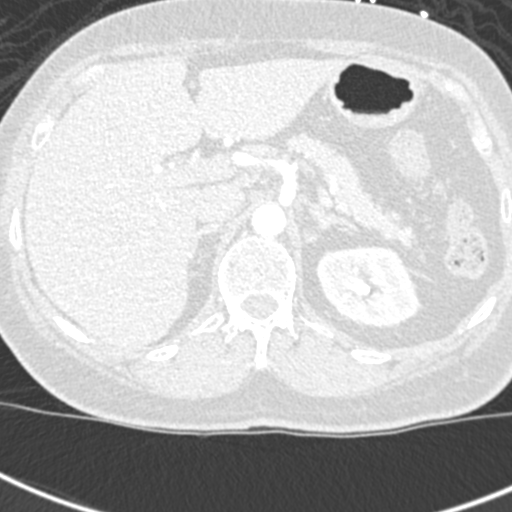
[im 41/310  soft-tissue]
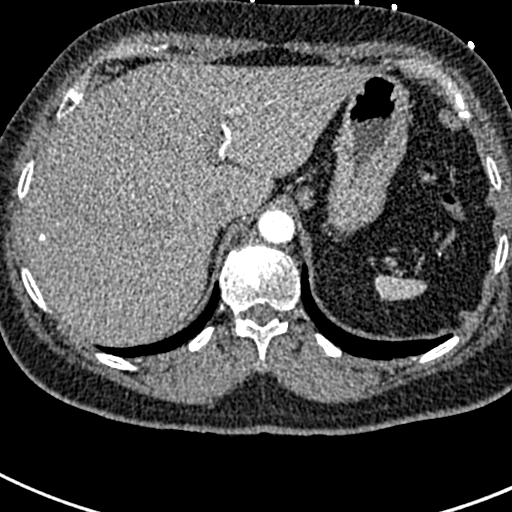
[im 54/310  lung]
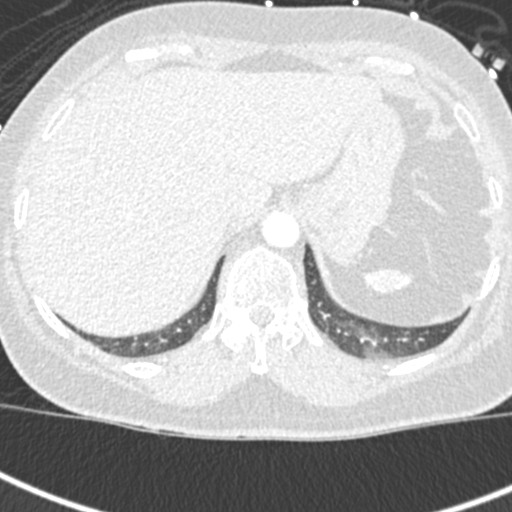
[im 81/310  soft-tissue]
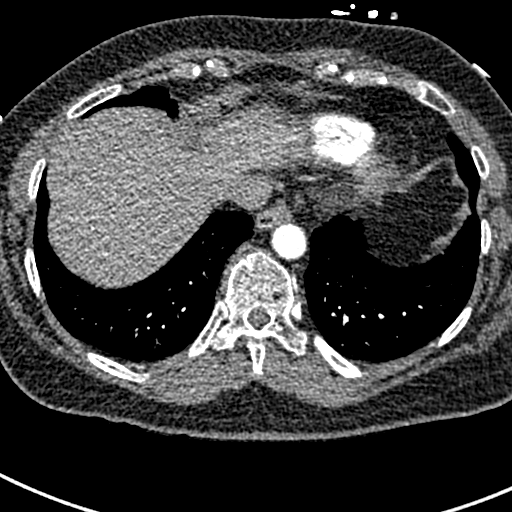
[im 95/310  lung]
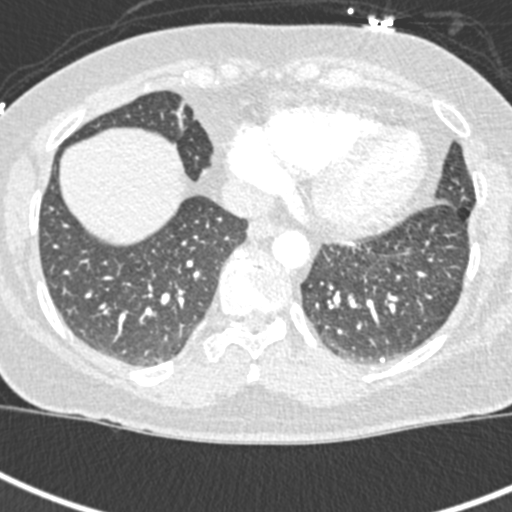
[im 121/310  soft-tissue]
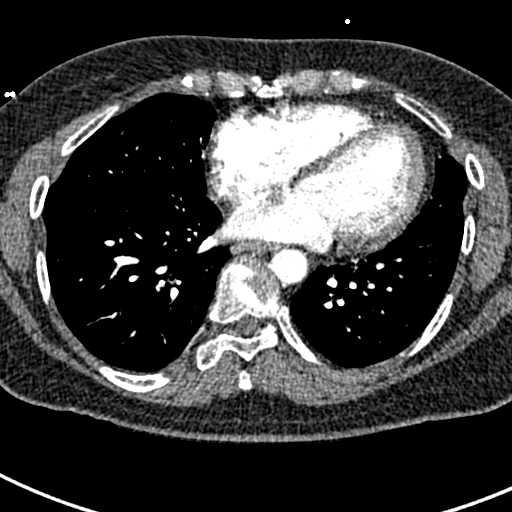
[im 135/310  lung]
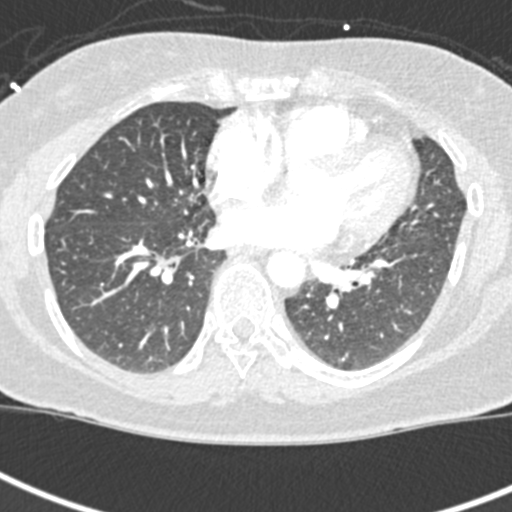
[im 162/310  soft-tissue]
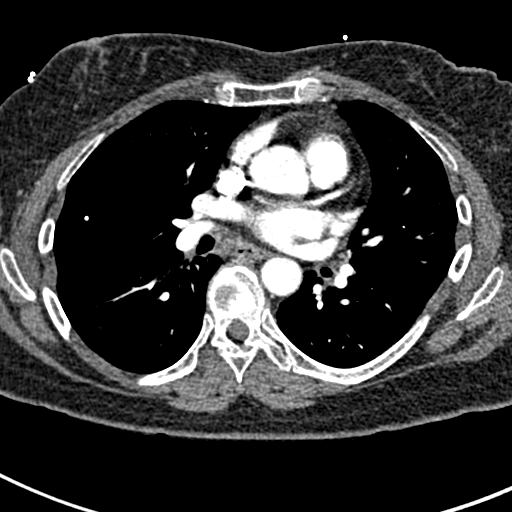
[im 175/310  lung]
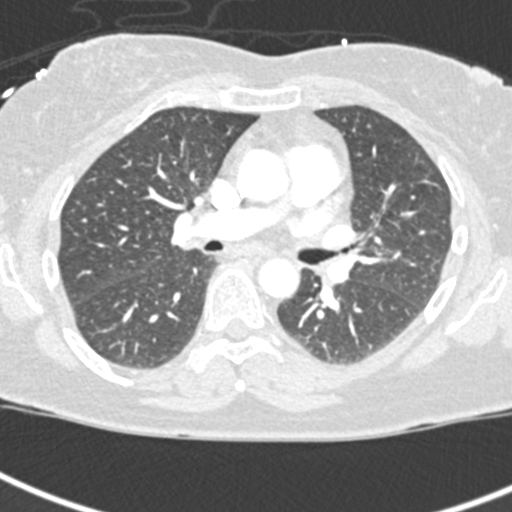
[im 189/310  soft-tissue]
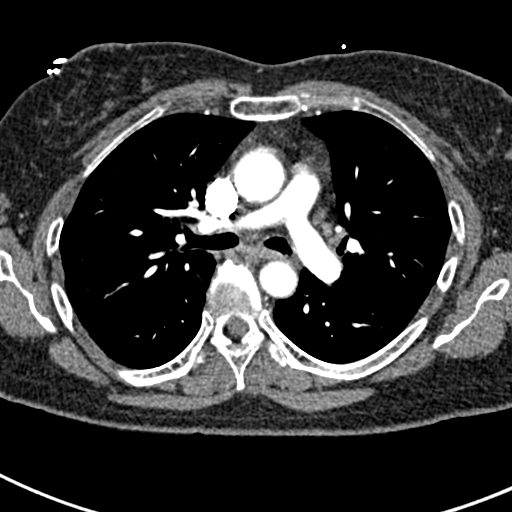
[im 215/310  lung]
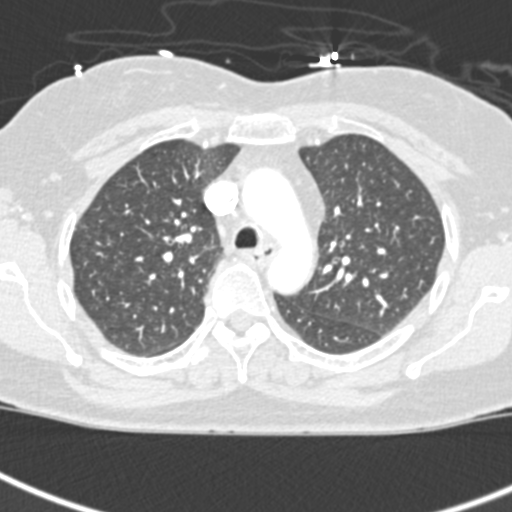
[im 229/310  soft-tissue]
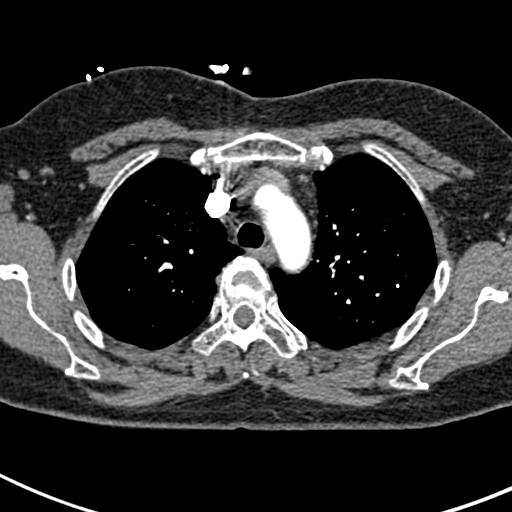
[im 256/310  lung]
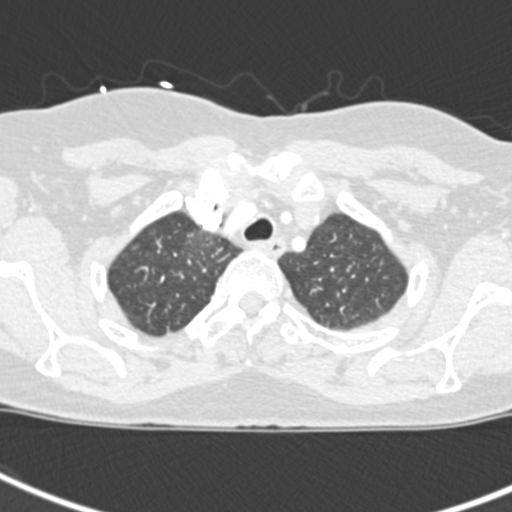
[im 269/310  soft-tissue]
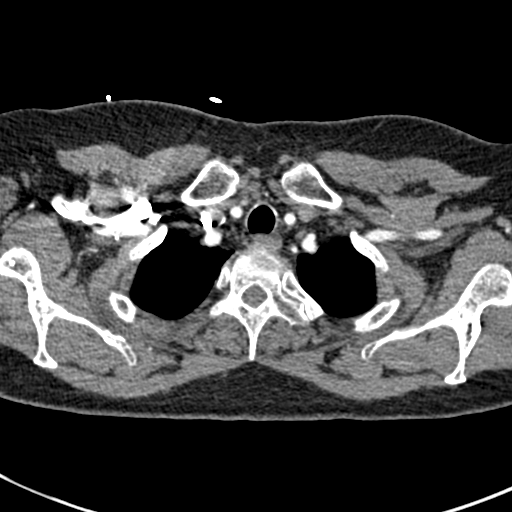
[im 296/310  lung]
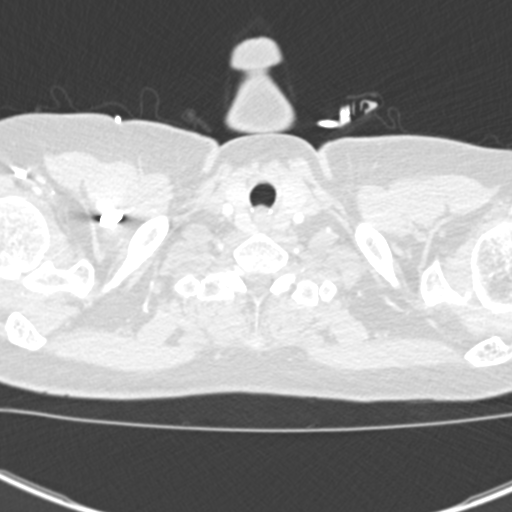

[Series 7: coronal mpr · coronal · 0.60mm/px · 3 of 127 slices shown]
[im 32/127  soft-tissue]
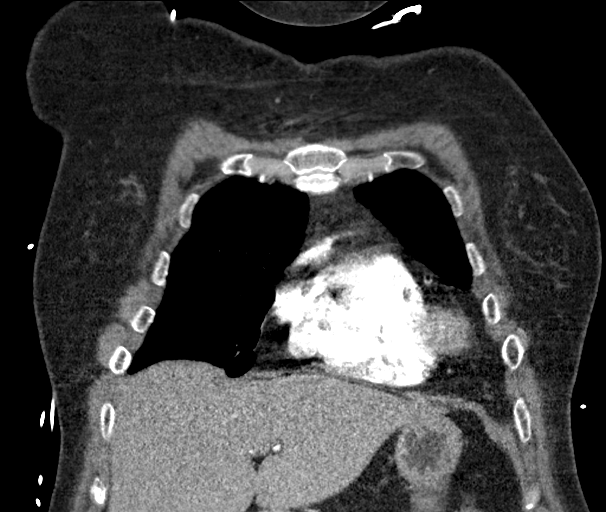
[im 64/127  soft-tissue]
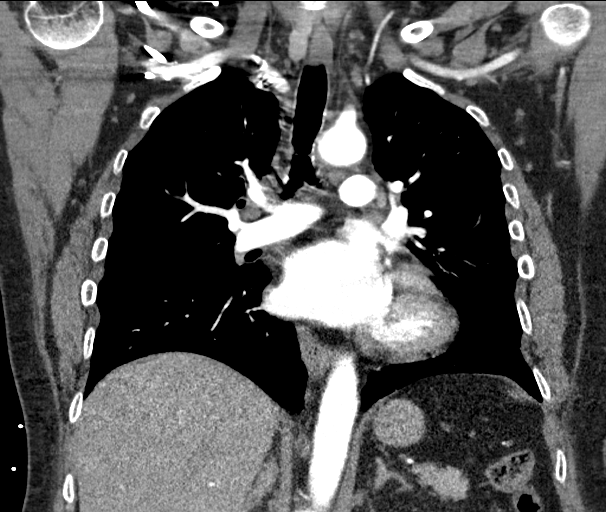
[im 95/127  soft-tissue]
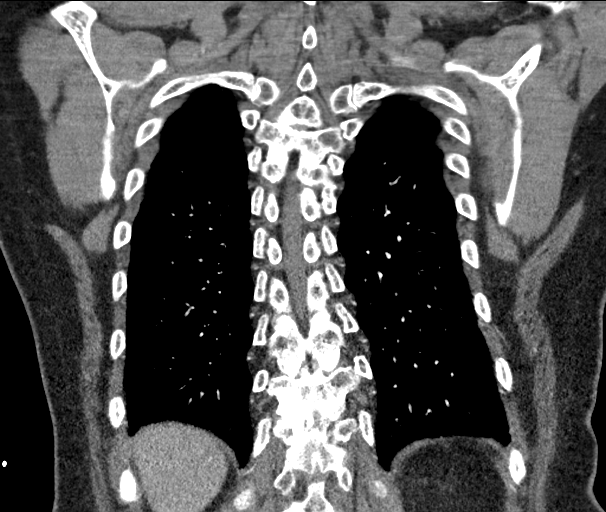

[18 of 46 positions shown; findings below may reference images not displayed]

FINDINGS: Cardiovascular: Satisfactory opacification of the pulmonary arteries
to the segmental level. No evidence of pulmonary embolism. The heart
is at the upper limits of normal in size. No pericardial effusion.
No thoracic aortic aneurysm or dissection.

Mediastinum/Nodes: No pathologically enlarged mediastinal, hilar, or
axillary lymph nodes. Thyroid gland, trachea, and esophagus
demonstrate no significant findings.

Lungs/Pleura: Upper lobe predominant diffuse ill-defined
centrilobular ground-glass micronodularity throughout both lungs. No
focal consolidation, pleural effusion, or pneumothorax. No
suspicious pulmonary nodule. Scattered calcified granulomas.

Upper Abdomen: No acute abnormality. Tiny spleen, suggestive of
prior autosplenectomy.

Musculoskeletal:

Review of the MIP images confirms the above findings.
IMPRESSION: 1.  No evidence of pulmonary embolism.
2. Pattern of upper lobe predominant diffuse ill-defined
centrilobular ground-glass attenuation micronodularity throughout
the lungs bilaterally. This is likely a manifestation of a smoking
related disease such as ALLAXYAR (respiratory bronchiolitis), or if the
patient is symptomatic, TIGER (respiratory
bronchiolitis-interstitial lung disease). This can also be seen with
acute hypersensitivity pneumonitis.

## 2020-04-01 ENCOUNTER — Inpatient Hospital Stay (HOSPITAL_COMMUNITY): Payer: Self-pay

## 2020-04-02 ENCOUNTER — Ambulatory Visit (HOSPITAL_COMMUNITY): Payer: Self-pay | Admitting: Nurse Practitioner

## 2020-04-02 ENCOUNTER — Telehealth (HOSPITAL_COMMUNITY): Payer: Self-pay | Admitting: Nurse Practitioner

## 2020-04-29 ENCOUNTER — Encounter (INDEPENDENT_AMBULATORY_CARE_PROVIDER_SITE_OTHER): Payer: Self-pay | Admitting: Internal Medicine

## 2021-06-28 ENCOUNTER — Emergency Department (HOSPITAL_COMMUNITY)
Admission: EM | Admit: 2021-06-28 | Discharge: 2021-06-28 | Disposition: A | Discharge status: Home / Self Care | Payer: Self-pay | Attending: Emergency Medicine | Admitting: Emergency Medicine

## 2021-06-28 ENCOUNTER — Other Ambulatory Visit: Payer: Self-pay

## 2021-06-28 ENCOUNTER — Emergency Department (HOSPITAL_COMMUNITY): Payer: Self-pay

## 2021-06-28 ENCOUNTER — Encounter (HOSPITAL_COMMUNITY): Payer: Self-pay

## 2021-06-28 DIAGNOSIS — R062 Wheezing: Secondary | ICD-10-CM | POA: Insufficient documentation

## 2021-06-28 DIAGNOSIS — R0602 Shortness of breath: Secondary | ICD-10-CM | POA: Insufficient documentation

## 2021-06-28 DIAGNOSIS — J449 Chronic obstructive pulmonary disease, unspecified: Secondary | ICD-10-CM | POA: Insufficient documentation

## 2021-06-28 DIAGNOSIS — R591 Generalized enlarged lymph nodes: Secondary | ICD-10-CM

## 2021-06-28 DIAGNOSIS — R1010 Upper abdominal pain, unspecified: Secondary | ICD-10-CM

## 2021-06-28 DIAGNOSIS — R10817 Generalized abdominal tenderness: Secondary | ICD-10-CM | POA: Insufficient documentation

## 2021-06-28 DIAGNOSIS — R0789 Other chest pain: Secondary | ICD-10-CM | POA: Insufficient documentation

## 2021-06-28 DIAGNOSIS — R509 Fever, unspecified: Secondary | ICD-10-CM | POA: Insufficient documentation

## 2021-06-28 DIAGNOSIS — F17210 Nicotine dependence, cigarettes, uncomplicated: Secondary | ICD-10-CM | POA: Insufficient documentation

## 2021-06-28 DIAGNOSIS — D72829 Elevated white blood cell count, unspecified: Secondary | ICD-10-CM | POA: Insufficient documentation

## 2021-06-28 DIAGNOSIS — R911 Solitary pulmonary nodule: Secondary | ICD-10-CM

## 2021-06-28 DIAGNOSIS — Z853 Personal history of malignant neoplasm of breast: Secondary | ICD-10-CM | POA: Insufficient documentation

## 2021-06-28 LAB — COMPREHENSIVE METABOLIC PANEL
ALT: 15 U/L (ref 0–44)
AST: 16 U/L (ref 15–41)
Albumin: 3.6 g/dL (ref 3.5–5.0)
Alkaline Phosphatase: 60 U/L (ref 38–126)
Anion gap: 8 (ref 5–15)
BUN: 11 mg/dL (ref 6–20)
CO2: 23 mmol/L (ref 22–32)
Calcium: 8.3 mg/dL — ABNORMAL LOW (ref 8.9–10.3)
Chloride: 103 mmol/L (ref 98–111)
Creatinine, Ser: 0.65 mg/dL (ref 0.44–1.00)
GFR, Estimated: >60 mL/min (ref >60)
Glucose, Bld: 97 mg/dL (ref 70–99)
Potassium: 3.4 mmol/L — ABNORMAL LOW (ref 3.5–5.1)
Sodium: 134 mmol/L — ABNORMAL LOW (ref 135–145)
Total Bilirubin: 0.5 mg/dL (ref 0.3–1.2)
Total Protein: 6.4 g/dL — ABNORMAL LOW (ref 6.5–8.1)

## 2021-06-28 LAB — LIPASE, BLOOD: Lipase: 25 U/L (ref 11–51)

## 2021-06-28 LAB — CBC WITH DIFFERENTIAL/PLATELET
Abs Immature Granulocytes: 0.07 10*3/uL (ref 0.00–0.07)
Basophils Absolute: 0.1 10*3/uL (ref 0.0–0.1)
Basophils Relative: 1 %
Eosinophils Absolute: 0.1 10*3/uL (ref 0.0–0.5)
Eosinophils Relative: 1 %
HCT: 34.7 % — ABNORMAL LOW (ref 36.0–46.0)
Hemoglobin: 11.6 g/dL — ABNORMAL LOW (ref 12.0–15.0)
Immature Granulocytes: 1 %
Lymphocytes Relative: 22 %
Lymphs Abs: 3.2 10*3/uL (ref 0.7–4.0)
MCH: 31.8 pg (ref 26.0–34.0)
MCHC: 33.4 g/dL (ref 30.0–36.0)
MCV: 95.1 fL (ref 80.0–100.0)
Monocytes Absolute: 1 10*3/uL (ref 0.1–1.0)
Monocytes Relative: 7 %
Neutro Abs: 10.2 10*3/uL — ABNORMAL HIGH (ref 1.7–7.7)
Neutrophils Relative %: 68 %
Platelets: 500 10*3/uL — ABNORMAL HIGH (ref 150–400)
RBC: 3.65 MIL/uL — ABNORMAL LOW (ref 3.87–5.11)
RDW: 14.2 % (ref 11.5–15.5)
WBC: 14.7 10*3/uL — ABNORMAL HIGH (ref 4.0–10.5)
nRBC: 0 % (ref 0.0–0.2)

## 2021-06-28 LAB — URINALYSIS, ROUTINE W REFLEX MICROSCOPIC
Bilirubin Urine: NEGATIVE
Glucose, UA: NEGATIVE mg/dL
Hgb urine dipstick: NEGATIVE
Ketones, ur: NEGATIVE mg/dL
Leukocytes,Ua: NEGATIVE
Nitrite: NEGATIVE
Protein, ur: NEGATIVE mg/dL
Specific Gravity, Urine: 1.034 — ABNORMAL HIGH (ref 1.005–1.030)
pH: 6 (ref 5.0–8.0)

## 2021-06-28 LAB — D-DIMER, QUANTITATIVE: D-Dimer, Quant: 0.69 ug/mL-FEU — ABNORMAL HIGH (ref 0.00–0.50)

## 2021-06-28 LAB — TROPONIN I (HIGH SENSITIVITY)
Troponin I (High Sensitivity): 3 ng/L (ref <18)
Troponin I (High Sensitivity): 4 ng/L (ref <18)

## 2021-06-28 LAB — POC OCCULT BLOOD, ED: Fecal Occult Bld: POSITIVE — AB

## 2021-06-28 LAB — CBG MONITORING, ED: Glucose-Capillary: 99 mg/dL (ref 70–99)

## 2021-06-28 LAB — LACTIC ACID, PLASMA: Lactic Acid, Venous: 0.8 mmol/L (ref 0.5–1.9)

## 2021-06-28 MED ORDER — IOHEXOL 350 MG/ML SOLN
100.0000 mL | Freq: Once | INTRAVENOUS | Status: AC | PRN
  Administered 2021-06-28: 100 mL via INTRAVENOUS

## 2021-06-28 MED ORDER — SODIUM CHLORIDE 0.9 % IV BOLUS
1000.0000 mL | Freq: Once | INTRAVENOUS | Status: AC
  Administered 2021-06-28: 1000 mL via INTRAVENOUS

## 2021-06-28 MED ORDER — SODIUM CHLORIDE 0.9 % IV SOLN: INTRAVENOUS | Status: DC

## 2021-06-28 NOTE — ED Notes (Signed)
Patient transported to CT 

## 2021-06-28 NOTE — ED Provider Notes (Signed)
Patient care signed out to follow-up CT scan results for final disposition/discharge.  CT scan results reviewed no acute abnormalities, small lung nodule noted and prominent lymph nodes in the mesentery.  Patient stable for outpatient follow-up for this.  Patient notified of this.  Golda Acre, MD 06/28/21 [phone]

## 2021-06-28 NOTE — ED Provider Notes (Signed)
[facility] EMERGENCY DEPARTMENT Provider Note   CSN: [id number] Arrival date & time: 06/28/21  1034     History Chief Complaint  Patient presents with   Chest Pain    Becky Gutierrez is a 60 y.o. female.   Chest Pain Associated symptoms: fever and shortness of breath    Patient presents to the ED with several complaints.  Patient states she has been having issues ongoing for years.  She has seen multiple doctors in the past as well as oncology.  Patient states she has had daily fevers for approximately 10 years.  She states she has seen oncology and has had evaluation including bone marrow biopsies.  Patient feels like her lymph nodes are swollen in her neck.  She wonders if her thymus is also swollen.  Patient has been having pain in her bilateral lower chest and upper abdomen.  She has been very fatigued and gets more short of breath with activity.  Patient does not have the energy that she thinks she should have.  Patient also started having increasing chest discomfort over the last several days so she came to the ED for further evaluation.  Patient states she is pretty sure she has B-cell lymphoma.  Past Medical History:  Diagnosis Date   Bipolar 1 disorder ([place])    Bipolar 1 disorder ([place])    Cancer ([place])     left breast cancer   COPD (chronic obstructive pulmonary disease) ([place])    Dyspnea    with exersion   PONV (postoperative nausea and vomiting)    with morphine given   Thyroid disease    Low T3    Patient Active Problem List   Diagnosis Date Noted   FUO (fever of unknown origin) 07/29/2019   Leukocytosis 06/02/2019   Esophageal dysphagia 09/17/2018   Nausea without vomiting 09/17/2018   Rectal bleeding 09/17/2018   Tear of MCL (medial collateral ligament) of knee 07/23/2012   Fracture, humerus, greater tuberosity 02/06/2012   Fracture of greater tuberosity of humerus 01/03/2012   SHOULDER PAIN 01/22/2008   IMPINGEMENT SYNDROME 01/22/2008    Past Surgical  History:  Procedure Laterality Date   ABDOMINAL HYSTERECTOMY     APPENDECTOMY     BREAST LUMPECTOMY     left side   CESAREAN SECTION     CHOLECYSTECTOMY     COLONOSCOPY N/A 10/21/2018   Procedure: COLONOSCOPY;  Surgeon: Rogene Houston, MD;  Location: [facility] ENDO SUITE;  Service: Endoscopy;  Laterality: N/A;  12:00-rescheduled 11/11 @ 7:30am per Lelon Frohlich   ESOPHAGOGASTRODUODENOSCOPY N/A 10/21/2018   Procedure: ESOPHAGOGASTRODUODENOSCOPY (EGD);  Surgeon: Rogene Houston, MD;  Location: [facility] ENDO SUITE;  Service: Endoscopy;  Laterality: N/A;   HEMORROIDECTOMY     POLYPECTOMY  10/21/2018   Procedure: POLYPECTOMY;  Surgeon: Rogene Houston, MD;  Location: [facility] ENDO SUITE;  Service: Endoscopy;;  colon     OB History     Gravida  2   Para  2   Term  2   Preterm      AB      Living  2      SAB      IAB      Ectopic      Multiple      Live Births              Family History  Problem Relation Age of Onset   Diabetes Father    Parkinson's disease Father    Depression Father  Heart disease Other    Hypertension Mother    Glaucoma Mother    Basal cell carcinoma Mother    Fibromyalgia Brother     Social History   Tobacco Use   Smoking status: Every Day    Packs/day: 0.75    Years: 30.00    Pack years: 22.50    Types: Cigarettes   Smokeless tobacco: Never  Vaping Use   Vaping Use: Never used  Substance Use Topics   Alcohol use: No   Drug use: No    Home Medications Prior to Admission medications   Medication Sig Start Date End Date Taking? Authorizing Provider  aspirin EC 81 MG tablet Take 325 mg by mouth daily as needed for mild pain. Swallow whole.   Yes [provider]  Cholecalciferol (VITAMIN D-3) 125 MCG (5000 UT) TABS Take 2 tablets by mouth daily.   Yes [provider]  Multiple Vitamin (MULTIVITAMIN) tablet Take 2 tablets by mouth daily.   Yes [provider]  pyridoxine (B-6) 200 MG tablet Take 200 mg by mouth daily.    Yes [provider]    Allergies    Beef-derived products, Codeine, Morphine, Promethazine hcl, and Alprazolam  Review of Systems   Review of Systems  Constitutional:  Positive for fever.  Respiratory:  Positive for chest tightness and shortness of breath.   Cardiovascular:  Positive for chest pain.  Genitourinary:  Positive for flank pain.  Musculoskeletal:  Positive for neck pain.  All other systems reviewed and are negative.  Physical Exam Updated Vital Signs BP 110/65   Pulse 78   Temp 98.6 F (37 C) (Oral)   Resp 16   Ht 1.753 m (5\' 9" )   Wt 86.2 kg   SpO2 99%   BMI 28.06 kg/m   Physical Exam Vitals and nursing note reviewed.  Constitutional:      General: She is not in acute distress.    Appearance: She is well-developed.  HENT:     Head: Normocephalic and atraumatic.     Right Ear: External ear normal.     Left Ear: External ear normal.  Eyes:     General: No scleral icterus.       Right eye: No discharge.        Left eye: No discharge.     Conjunctiva/sclera: Conjunctivae normal.  Neck:     Thyroid: No thyroid mass.     Trachea: No tracheal deviation.  Cardiovascular:     Rate and Rhythm: Normal rate and regular rhythm.  Pulmonary:     Effort: Pulmonary effort is normal. No respiratory distress.     Breath sounds: No stridor. Wheezing present. No rales.     Comments: Mild intermittent wheeze noted Abdominal:     General: Bowel sounds are normal. There is no distension.     Palpations: Abdomen is soft.     Tenderness: There is generalized abdominal tenderness. There is no guarding or rebound.  Musculoskeletal:        General: No tenderness or deformity.     Cervical back: Neck supple.  Lymphadenopathy:     Cervical: No cervical adenopathy.  Skin:    General: Skin is warm and dry.     Findings: No rash.  Neurological:     General: No focal deficit present.     Mental Status: She is alert.     Cranial Nerves: No cranial nerve deficit (no  facial droop, extraocular movements intact, no slurred speech).  Sensory: No sensory deficit.     Motor: No abnormal muscle tone or seizure activity.     Coordination: Coordination normal.  Psychiatric:        Mood and Affect: Mood normal.    ED Results / Procedures / Treatments   Labs (all labs ordered are listed, but only abnormal results are displayed) Labs Reviewed - No data to display  EKG EKG Interpretation  Date/Time:  Tuesday June 28 2021 10:49:54 EDT Ventricular Rate:  85 PR Interval:  167 QRS Duration: 94 QT Interval:  395 QTC Calculation: 470 R Axis:   13 Text Interpretation: Sinus rhythm bundle branch block on prior ECG resolved Confirmed by Dorie Rank [id number]) on 06/28/2021 10:55:55 AM  Radiology No results found.  Procedures Procedures   Medications Ordered in ED Medications - No data to display  ED Course  I have reviewed the triage vital signs and the nursing notes.  Pertinent labs & imaging results that were available during my care of the patient were reviewed by me and considered in my medical decision making (see chart for details).  Clinical Course as of 06/28/21 1538  Tue Jun 28, 2021  1411 Leukocytosis noted.  Similar to previous values.  Hemoglobin decreased from previous [JK]    Clinical Course User Index [JK] Dorie Rank, MD   MDM Rules/Calculators/A&P                          Patient presented to the ED with several complaints ongoing for a long period of time.  Acutely the patient has been having issues with her chest and abdomen.  She is also noted some blood in her stool.  Patient is having abdominal tenderness on exam.  She does have leukocytosis and evidence of guaiac positive stools.  Initial troponin is normal.  Ddimer is positive. ?PE, colitis, diverticulitis?. We will proceed with CT angiogram of her chest abdomen pelvis for further evaluation.  Care turned over to Dr Maryfrances Bunnell, MD 06/28/21 1540

## 2021-06-28 NOTE — Discharge Instructions (Addendum)
Follow-up with local doctor and have repeat chest x-ray or CT of your chest in approximately 6 to 12 months to ensure nodule not enlarging.

## 2021-06-28 NOTE — ED Triage Notes (Signed)
Pt presents to ED with complaints of chest pressure on and off for 2 weeks, becomes diaphoretic with pressure. Pt states she doesn't have the stamina she use to.

## 2023-03-27 ENCOUNTER — Emergency Department (HOSPITAL_COMMUNITY)
Admission: EM | Admit: 2023-03-27 | Discharge: 2023-03-27 | Disposition: A | Discharge status: Home / Self Care | Payer: Self-pay | Attending: Emergency Medicine | Admitting: Emergency Medicine

## 2023-03-27 ENCOUNTER — Other Ambulatory Visit: Payer: Self-pay

## 2023-03-27 DIAGNOSIS — J029 Acute pharyngitis, unspecified: Secondary | ICD-10-CM | POA: Insufficient documentation

## 2023-03-27 LAB — GROUP A STREP BY PCR: Group A Strep by PCR: NOT DETECTED

## 2023-03-27 MED ORDER — PENICILLIN G BENZATHINE 1200000 UNIT/2ML IM SUSY
1.2000 10*6.[IU] | PREFILLED_SYRINGE | Freq: Once | INTRAMUSCULAR | Status: AC
  Administered 2023-03-27: 1.2 10*6.[IU] via INTRAMUSCULAR
  Filled 2023-03-27: qty 2

## 2023-03-27 NOTE — ED Provider Notes (Signed)
[place] EMERGENCY DEPARTMENT AT [facility] Provider Note   CSN: [id number] Arrival date & time: 03/27/23  1458     History  Chief Complaint  Patient presents with   Sore Throat   Fever    Becky Gutierrez is a 62 y.o. female.   Sore Throat Pertinent negatives include no chest pain, no headaches and no shortness of breath.  Fever Associated symptoms: myalgias and sore throat   Associated symptoms: no chest pain, no chills, no congestion, no cough, no headaches, no nausea and no vomiting        Becky Gutierrez is a 62 y.o. female who presents to the Emergency Department complaining of fever and sore throat x 2 to 3 days.  Max fever at home of 101 last night.  Describes having pain of her throat with swallowing and painful lymph nodes of her neck.  Had strep throat in the past and current symptoms feel similar.  She noticed white patches to the back of her throat yesterday.  Also endorses generalized bodyaches.  Denies chest pain, significant cough, or congestion.  Home Medications Prior to Admission medications   Medication Sig Start Date End Date Taking? Authorizing Provider  aspirin EC 81 MG tablet Take 325 mg by mouth daily as needed for mild pain. Swallow whole.    [provider]  Cholecalciferol (VITAMIN D-3) 125 MCG (5000 UT) TABS Take 2 tablets by mouth daily.    [provider]  Multiple Vitamin (MULTIVITAMIN) tablet Take 2 tablets by mouth daily.    [provider]  pyridoxine (B-6) 200 MG tablet Take 200 mg by mouth daily.    [provider]      Allergies    Beef-derived products, Codeine, Morphine, Promethazine hcl, and Alprazolam    Review of Systems   Review of Systems  Constitutional:  Positive for fever. Negative for appetite change and chills.  HENT:  Positive for sore throat. Negative for congestion, trouble swallowing and voice change.   Respiratory:  Negative for cough and shortness of breath.    Cardiovascular:  Negative for chest pain.  Gastrointestinal:  Negative for nausea and vomiting.  Musculoskeletal:  Positive for myalgias. Negative for neck pain and neck stiffness.  Neurological:  Negative for dizziness and headaches.    Physical Exam Updated Vital Signs BP 128/82 (BP Location: Right Arm)   Pulse 87   Temp 98.6 F (37 C) (Oral)   Resp 16   Ht  (1.753 m)   Wt 83.9 kg   SpO2 98%   BMI 27.32 kg/m  Physical Exam Vitals and nursing note reviewed.  Constitutional:      General: She is not in acute distress.    Appearance: Normal appearance. She is well-developed. She is not ill-appearing.  HENT:     Right Ear: Tympanic membrane and ear canal normal.     Left Ear: Tympanic membrane and ear canal normal.     Nose: No congestion or rhinorrhea.     Mouth/Throat:     Mouth: Mucous membranes are moist. No oral lesions.     Pharynx: Uvula midline. Posterior oropharyngeal erythema present. No pharyngeal swelling, oropharyngeal exudate or uvula swelling.     Tonsils: No tonsillar abscesses.     Comments: Erythema of the oropharynx.  No appreciable exudates.  Uvula midline nonedematous. Neck:     Trachea: Trachea and phonation normal.  Cardiovascular:     Rate and Rhythm: Normal rate and regular rhythm.  Pulses: Normal pulses.  Pulmonary:     Effort: Pulmonary effort is normal.     Breath sounds: Normal breath sounds.  Abdominal:     Palpations: Abdomen is soft.     Tenderness: There is no abdominal tenderness.  Musculoskeletal:        General: Normal range of motion.     Cervical back: Normal range of motion.  Lymphadenopathy:     Cervical: Cervical adenopathy present.     Right cervical: Superficial cervical adenopathy present.     Left cervical: Superficial cervical adenopathy present.  Skin:    General: Skin is warm.     Capillary Refill: Capillary refill takes less than 2 seconds.  Neurological:     General: No focal deficit present.     Mental  Status: She is alert.     Sensory: No sensory deficit.     Motor: No weakness.     ED Results / Procedures / Treatments   Labs (all labs ordered are listed, but only abnormal results are displayed) Labs Reviewed  GROUP A STREP BY PCR    EKG None  Radiology No results found.  Procedures Procedures    Medications Ordered in ED Medications  penicillin g benzathine (BICILLIN LA) 1200000 UNIT/2ML injection 1.2 Million Units (has no administration in time range)    ED Course/ Medical Decision Making/ A&P                             Medical Decision Making Patient who is a nurse, here with sore throat and reported white patches to the back of her throat fever as well.  Symptoms present for 2 to 3 days.  Max fever at home of 101 yesterday.  Endorses generalized bodyaches and states she has had strep throat in the past.  No fever here, vital signs reassuring.  Uvula midline nonedematous.  No bulging of the soft palate on exam there is erythema an some cervical lymphadenopathy present  Will obtain strep PCR.  Differential would also include strep pharyngitis, peritonsillar abscess, retropharyngeal abscess as well as viral process.  Based on her clinical findings, retropharyngeal and peritonsillar abscess felt less likely.  Amount and/or Complexity of Data Reviewed Labs: ordered.    Details: Strep PCR negative Discussion of management or test interpretation with external provider(s): Patient symptoms suggestive of strep with possible false negative result. patient requesting IM injection of Bicillin. I feel this is reasonable given her symptoms.   Agrees to symptomatic treatment with antipyretics for fever and she will follow-up closely outpatient with PCP.  Appears appropriate for discharge home.  Risk Prescription drug management.           Final Clinical Impression(s) / ED Diagnoses Final diagnoses:  Sore throat    Rx / DC Orders ED Discharge Orders     None          Pauline Aus, PA-C 03/27/23 1818    Bethann Berkshire, MD 03/28/23 1040

## 2023-03-27 NOTE — ED Triage Notes (Signed)
Pt complains of sore throat with white patches in back of throat and intermittent fever x 2 days.

## 2023-03-27 NOTE — Discharge Instructions (Signed)
You may alternate Tylenol and/or ibuprofen every 4 and 6 hours respectively for fever and/or bodyaches.  Drink plenty of fluids.  Please follow-up with your primary care provider for recheck in a few days if your symptoms are not improving.

## 2023-10-05 ENCOUNTER — Encounter (INDEPENDENT_AMBULATORY_CARE_PROVIDER_SITE_OTHER): Payer: Self-pay | Admitting: *Deleted
